# Patient Record
Sex: Female | Born: 1990 | Race: Black or African American | Hispanic: No | Marital: Single | Smoking: Never smoker
Health system: Southern US, Community
[De-identification: ages and names within clinical notes are randomized; demographics above are authoritative.]

## PROBLEM LIST (undated history)

## (undated) ENCOUNTER — Inpatient Hospital Stay (HOSPITAL_COMMUNITY): Payer: Self-pay

## (undated) DIAGNOSIS — N926 Irregular menstruation, unspecified: Secondary | ICD-10-CM

## (undated) DIAGNOSIS — Z8619 Personal history of other infectious and parasitic diseases: Secondary | ICD-10-CM

## (undated) DIAGNOSIS — T7840XA Allergy, unspecified, initial encounter: Secondary | ICD-10-CM

## (undated) DIAGNOSIS — Z202 Contact with and (suspected) exposure to infections with a predominantly sexual mode of transmission: Secondary | ICD-10-CM

## (undated) HISTORY — DX: Allergy, unspecified, initial encounter: T78.40XA

## (undated) HISTORY — DX: Irregular menstruation, unspecified: N92.6

## (undated) HISTORY — DX: Contact with and (suspected) exposure to infections with a predominantly sexual mode of transmission: Z20.2

## (undated) HISTORY — DX: Personal history of other infectious and parasitic diseases: Z86.19

## (undated) HISTORY — PX: NO PAST SURGERIES: SHX2092

---
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
## 2010-03-27 ENCOUNTER — Emergency Department (HOSPITAL_COMMUNITY)
Admission: EM | Admit: 2010-03-27 | Discharge: 2010-03-27 | Disposition: A | Discharge status: Home / Self Care | Payer: BC Managed Care – PPO | Attending: Emergency Medicine | Admitting: Emergency Medicine

## 2010-03-27 ENCOUNTER — Emergency Department (HOSPITAL_COMMUNITY): Payer: BC Managed Care – PPO

## 2010-03-27 DIAGNOSIS — K5900 Constipation, unspecified: Secondary | ICD-10-CM | POA: Insufficient documentation

## 2010-03-27 DIAGNOSIS — J45909 Unspecified asthma, uncomplicated: Secondary | ICD-10-CM | POA: Insufficient documentation

## 2010-03-27 DIAGNOSIS — R109 Unspecified abdominal pain: Secondary | ICD-10-CM | POA: Insufficient documentation

## 2010-03-27 LAB — URINALYSIS, ROUTINE W REFLEX MICROSCOPIC
Hgb urine dipstick: NEGATIVE
Specific Gravity, Urine: 1.029 (ref 1.005–1.030)
Urobilinogen, UA: 1 mg/dL (ref 0.0–1.0)
pH: 6.5 (ref 5.0–8.0)

## 2010-03-27 LAB — URINE MICROSCOPIC-ADD ON

## 2010-05-03 LAB — URINALYSIS, ROUTINE W REFLEX MICROSCOPIC
Bilirubin Urine: NEGATIVE
Glucose, UA: NEGATIVE mg/dL
Glucose, UA: NEGATIVE mg/dL
Hgb urine dipstick: NEGATIVE
Ketones, ur: 15 mg/dL — AB
Ketones, ur: >80 mg/dL — AB
Nitrite: NEGATIVE
Protein, ur: 30 mg/dL — AB
Specific Gravity, Urine: 1.028 (ref 1.005–1.030)
Specific Gravity, Urine: 1.029 (ref 1.005–1.030)
Urobilinogen, UA: 1 mg/dL (ref 0.0–1.0)
pH: 6 (ref 5.0–8.0)

## 2010-05-03 LAB — URINE MICROSCOPIC-ADD ON

## 2010-05-03 LAB — DIFFERENTIAL
Basophils Relative: 1 % (ref 0–1)
Monocytes Absolute: 0.6 10*3/uL (ref 0.1–1.0)
Monocytes Relative: 8 % (ref 3–12)
Neutro Abs: 5.8 10*3/uL (ref 1.7–7.7)

## 2010-05-03 LAB — CBC
Hemoglobin: 13.4 g/dL (ref 12.0–15.0)
MCHC: 34.4 g/dL (ref 30.0–36.0)
MCV: 94.6 fL (ref 78.0–100.0)
RBC: 4.13 MIL/uL (ref 3.87–5.11)

## 2010-05-03 LAB — BASIC METABOLIC PANEL
CO2: 22 mEq/L (ref 19–32)
Chloride: 102 mEq/L (ref 96–112)
GFR calc Af Amer: >60 mL/min (ref >60)
Sodium: 133 mEq/L — ABNORMAL LOW (ref 135–145)

## 2010-09-19 ENCOUNTER — Encounter: Payer: Self-pay | Admitting: Family Medicine

## 2010-09-19 ENCOUNTER — Ambulatory Visit (INDEPENDENT_AMBULATORY_CARE_PROVIDER_SITE_OTHER): Payer: BC Managed Care – PPO | Admitting: Family Medicine

## 2010-09-19 VITALS — BP 94/60 | HR 80 | Ht 64.0 in | Wt 127.0 lb

## 2010-09-19 DIAGNOSIS — L309 Dermatitis, unspecified: Secondary | ICD-10-CM

## 2010-09-19 DIAGNOSIS — Z309 Encounter for contraceptive management, unspecified: Secondary | ICD-10-CM

## 2010-09-19 DIAGNOSIS — Z9109 Other allergy status, other than to drugs and biological substances: Secondary | ICD-10-CM

## 2010-09-19 DIAGNOSIS — J309 Allergic rhinitis, unspecified: Secondary | ICD-10-CM

## 2010-09-19 DIAGNOSIS — L259 Unspecified contact dermatitis, unspecified cause: Secondary | ICD-10-CM

## 2010-09-19 DIAGNOSIS — K5900 Constipation, unspecified: Secondary | ICD-10-CM | POA: Insufficient documentation

## 2010-09-19 DIAGNOSIS — J45909 Unspecified asthma, uncomplicated: Secondary | ICD-10-CM | POA: Insufficient documentation

## 2010-09-19 DIAGNOSIS — M549 Dorsalgia, unspecified: Secondary | ICD-10-CM

## 2010-09-19 MED ORDER — ALBUTEROL SULFATE HFA 108 (90 BASE) MCG/ACT IN AERS: 2.0000 | INHALATION_SPRAY | RESPIRATORY_TRACT | Status: DC | PRN

## 2010-09-19 MED ORDER — EPINEPHRINE 0.3 MG/0.3ML IJ DEVI: 0.3000 mg | Freq: Once | INTRAMUSCULAR | Status: DC

## 2010-09-19 MED ORDER — TRIAMCINOLONE ACETONIDE 0.1 % EX OINT: TOPICAL_OINTMENT | Freq: Two times a day (BID) | CUTANEOUS | Status: DC

## 2010-09-19 NOTE — Patient Instructions (Signed)
Think about the new birth control options, if you are leaning toward Implanon or Norplanon insert then this will have to be done at your OB/GYN office Use the inhaler as needed during the winter and spring months Use the cream for your eczema, try Eucerin brand lotion You can try Miralax- if you have bad constipation- follow instructions on the bottle I will get your PAP Smear records from Dr. Esmond Camper office I recommend that you get a flu shot  For your back- for severe pain use Ibuprofen, heating pad  Next visit in 6 months

## 2010-09-19 NOTE — Assessment & Plan Note (Signed)
Her exam was normal. This may be related to musculoskeletal strain during her job. I advised her to use over-the-counter NSAIDs as well as heating pad. If this persists or gets worse we will obtain X  rays

## 2010-09-19 NOTE — Assessment & Plan Note (Signed)
Advised the use of Benadryl if patient able to swallow. She should keep this with her at all times. I will send an EpiPen. I will also send her to an allergist

## 2010-09-19 NOTE — Assessment & Plan Note (Signed)
Encourage plenty of fiber and water. She is a very picky eater. Also advised MiraLAX if she has not had a bowel movement in many days

## 2010-09-19 NOTE — Assessment & Plan Note (Signed)
The patient has intermittent asthma. I have given her an albuterol inhaler to use as needed. It does not seem like she needs a maintenance inhaler at this time

## 2010-09-19 NOTE — Assessment & Plan Note (Signed)
Triamcinolone cream given. Eucerin for hydration
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
## 2010-09-19 NOTE — Assessment & Plan Note (Signed)
We discussed some options. I gave her a handout. Her other options besides birth control pills will make her cycles even more irregular. She will review this. If she decides to have been implant placed she will need to see her GYN for this. I did advise against an IUD as she has never had a child.

## 2010-11-17 ENCOUNTER — Encounter (HOSPITAL_COMMUNITY): Payer: Self-pay | Admitting: *Deleted

## 2010-11-17 ENCOUNTER — Inpatient Hospital Stay (HOSPITAL_COMMUNITY)
Admission: AD | Admit: 2010-11-17 | Discharge: 2010-11-17 | Disposition: A | Discharge status: Home / Self Care | Payer: BC Managed Care – PPO | Source: Ambulatory Visit | Attending: Obstetrics and Gynecology | Admitting: Obstetrics and Gynecology

## 2010-11-17 ENCOUNTER — Inpatient Hospital Stay (HOSPITAL_COMMUNITY): Payer: BC Managed Care – PPO

## 2010-11-17 DIAGNOSIS — O209 Hemorrhage in early pregnancy, unspecified: Secondary | ICD-10-CM | POA: Insufficient documentation

## 2010-11-17 LAB — URINE MICROSCOPIC-ADD ON

## 2010-11-17 LAB — URINALYSIS, ROUTINE W REFLEX MICROSCOPIC
Bilirubin Urine: NEGATIVE
Glucose, UA: NEGATIVE mg/dL
Ketones, ur: 15 mg/dL — AB
Protein, ur: NEGATIVE mg/dL
pH: 6 (ref 5.0–8.0)

## 2010-11-17 NOTE — Progress Notes (Signed)
Pt started bleeding bright red  one hr ago, now blood is brown/black.  No cramping or abd pain, denies any UTI symptoms.

## 2010-11-17 NOTE — Progress Notes (Signed)
Pt reports having vaginal bleeding like a period thjat started about an hour ago. deneis pain or cramping at this time

## 2010-11-17 NOTE — ED Provider Notes (Signed)
History     Chief Complaint  Patient presents with  . Vaginal Bleeding   HPI G1 8.4 wks pregnancy by dates, awaiting [facility] at [facility]. C/o vaginal bleeding and cramping.  No trauma/ intercourse.   Past Medical History  Diagnosis Date  . Asthma   . Allergy     seasonal, food  . Constipation   . Chlamydia contact, treated     Age 20    No past surgical history on file.  Family History  Problem Relation Age of Onset  . Cancer Maternal Grandmother     breast   History  Substance Use Topics  . Smoking status: Never Smoker   . Smokeless tobacco: Not on file  . Alcohol Use: Yes     socially   Allergies:  Allergies  Allergen Reactions  . Food Anaphylaxis    NUTS  . Shellfish Allergy Anaphylaxis   Prescriptions prior to admission  Medication Sig Dispense Refill  . albuterol (VENTOLIN HFA) 108 (90 BASE) MCG/ACT inhaler Inhale 2 puffs into the lungs every 4 (four) hours as needed for wheezing.  1 Inhaler  3  . prenatal vitamin w/FE, FA (PRENATAL 1 + 1) 27-1 MG TABS Take 1 tablet by mouth daily.        Marland Kitchen desogestrel-ethinyl estradiol (APRI,EMOQUETTE,SOLIA) 0.15-30 MG-MCG tablet Take 1 tablet by mouth daily.        Marland Kitchen EPINEPHrine (EPIPEN) 0.3 mg/0.3 mL DEVI Inject 0.3 mLs (0.3 mg total) into the muscle once.  1 Device  3  . triamcinolone (KENALOG) 0.1 % ointment Apply topically 2 (two) times daily. As needed for eczema  30 g  6    ROS denies dizziness/ SOB/ chest pain.   Physical Exam   Blood pressure 112/62, pulse 89, temperature 98.7 F (37.1 C), temperature source Oral, resp. rate 16, height 5\' 4"  (1.626 m), weight 59.966 kg (132 lb 3.2 oz), last menstrual period 09/18/2010.  Physical Exam A&O x 3, no acute distress. Pleasant HEENT neg, no thyromegaly Lungs CTA bilat CV RRR, S1S2 normal Abdo soft, non tender, non acute Extr no edema/ tenderness Pelvic Cx closed, long, no active bleeding. Sono- live single IUP  MAU Course  Procedures Pelvic sono --> Live  single IUP 8.5/7 wks. Nl ovaries. Blood type --> B(+)   Assessment and Plan  First trimester bleeding.  ABO/Rh, pelvic sono. Office f/up 1-2 wks.   Kirke Breach R 11/17/2010, 5:15 PM

## 2010-11-20 ENCOUNTER — Encounter (HOSPITAL_COMMUNITY): Payer: Self-pay | Admitting: *Deleted

## 2010-11-20 ENCOUNTER — Inpatient Hospital Stay (HOSPITAL_COMMUNITY)
Admission: AD | Admit: 2010-11-20 | Discharge: 2010-11-20 | Disposition: A | Discharge status: Home / Self Care | Payer: BC Managed Care – PPO | Source: Ambulatory Visit | Attending: Obstetrics & Gynecology | Admitting: Obstetrics & Gynecology

## 2010-11-20 DIAGNOSIS — O99891 Other specified diseases and conditions complicating pregnancy: Secondary | ICD-10-CM | POA: Insufficient documentation

## 2010-11-20 DIAGNOSIS — IMO0002 Reserved for concepts with insufficient information to code with codable children: Secondary | ICD-10-CM

## 2010-11-20 DIAGNOSIS — M545 Low back pain: Secondary | ICD-10-CM

## 2010-11-20 DIAGNOSIS — Y92009 Unspecified place in unspecified non-institutional (private) residence as the place of occurrence of the external cause: Secondary | ICD-10-CM | POA: Insufficient documentation
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
## 2010-11-20 NOTE — Progress Notes (Signed)
Dr cousins called back and states that patient have not been seen in their office for this pregnancy. She was told prior to her appointment that her insurance does not have pregnancy coverage. Patient then states that she will apply for medicaid and their office do not accept. Dr cousins states that the patient is faculty practice and should be changed as unassigned.
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
## 2010-11-20 NOTE — ED Provider Notes (Signed)
Attestation of Attending Supervision of Advanced Practitioner: Evaluation and management procedures were performed by the PA/NP/CNM/OB Fellow under my supervision/collaboration. Chart reviewed and agree with management and plan.  Hades Mathew A M.D. 11/20/2010 4:40 AM   

## 2010-11-20 NOTE — Progress Notes (Signed)
WAS IN OFFICE TODAY-  PT HAS BC/BS-  IT DOES NOT  COVER PREG- THEY TOLD HER THEY NEED MONEY  BEFORE SHE GETS SEEN..  HAS BEEN   THERE FOR   GYN- PAP SMEARS.
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
## 2011-01-28 NOTE — L&D Delivery Note (Signed)
Delivery Note Plan for water birth, used tub briefly awaiting epidural, to bed, R side lying, SVD, easy delivery of the head, easy delivery of the shoulders, cord doubly clamped and cut by father of baby, baby on pt abdomin. At 12:38 PM a viable female was delivered via Vaginal, Spontaneous Delivery (Presentation: Left Occiput Anterior).  APGAR: 9, 9; weight .   Placenta status: Intact, Spontaneous.  Cord: 3 vessels. Intact placenta.  Anesthesia: Local  Episiotomy: None Lacerations:  Suture Repair: 3.0 monocryl Est. Blood Loss (mL):   Mom to postpartum.  Baby to rooming in.  Cynthia Mcdowell 06/24/2011, 1:07 PM

## 2011-02-06 LAB — OB RESULTS CONSOLE HIV ANTIBODY (ROUTINE TESTING): HIV: NONREACTIVE

## 2011-02-06 LAB — CBC
HCT: 36 % (ref 36–46)
Hemoglobin: 12.8 g/dL (ref 12.0–16.0)
Platelets: 213 10*3/uL (ref 150–399)

## 2011-02-06 LAB — OB RESULTS CONSOLE ABO/RH: RH Type: POSITIVE

## 2011-02-06 LAB — OB RESULTS CONSOLE GC/CHLAMYDIA
Chlamydia: NEGATIVE
Gonorrhea: NEGATIVE

## 2011-02-06 LAB — OB RESULTS CONSOLE ANTIBODY SCREEN: Antibody Screen: NEGATIVE

## 2011-02-12 ENCOUNTER — Other Ambulatory Visit (HOSPITAL_COMMUNITY): Payer: Self-pay | Admitting: Obstetrics and Gynecology

## 2011-02-12 ENCOUNTER — Other Ambulatory Visit: Payer: Self-pay

## 2011-02-12 DIAGNOSIS — IMO0002 Reserved for concepts with insufficient information to code with codable children: Secondary | ICD-10-CM

## 2011-02-18 ENCOUNTER — Ambulatory Visit (HOSPITAL_COMMUNITY)
Admission: RE | Admit: 2011-02-18 | Discharge: 2011-02-18 | Disposition: A | Discharge status: Home / Self Care | Payer: BC Managed Care – PPO | Source: Ambulatory Visit | Attending: Obstetrics and Gynecology | Admitting: Obstetrics and Gynecology

## 2011-02-18 DIAGNOSIS — Z363 Encounter for antenatal screening for malformations: Secondary | ICD-10-CM | POA: Insufficient documentation

## 2011-02-18 DIAGNOSIS — Z1389 Encounter for screening for other disorder: Secondary | ICD-10-CM | POA: Insufficient documentation

## 2011-02-18 DIAGNOSIS — O358XX0 Maternal care for other (suspected) fetal abnormality and damage, not applicable or unspecified: Secondary | ICD-10-CM | POA: Insufficient documentation

## 2011-02-18 DIAGNOSIS — IMO0002 Reserved for concepts with insufficient information to code with codable children: Secondary | ICD-10-CM

## 2011-03-20 ENCOUNTER — Ambulatory Visit: Payer: BC Managed Care – PPO | Admitting: Family Medicine

## 2011-03-20 ENCOUNTER — Encounter: Payer: Self-pay | Admitting: Family Medicine

## 2011-04-10 ENCOUNTER — Other Ambulatory Visit: Payer: Medicaid Other

## 2011-04-10 ENCOUNTER — Encounter (INDEPENDENT_AMBULATORY_CARE_PROVIDER_SITE_OTHER): Payer: Medicaid Other

## 2011-04-10 DIAGNOSIS — Z331 Pregnant state, incidental: Secondary | ICD-10-CM

## 2011-04-24 ENCOUNTER — Encounter (INDEPENDENT_AMBULATORY_CARE_PROVIDER_SITE_OTHER): Payer: Medicaid Other | Admitting: Registered Nurse

## 2011-04-24 DIAGNOSIS — Z3480 Encounter for supervision of other normal pregnancy, unspecified trimester: Secondary | ICD-10-CM

## 2011-05-08 ENCOUNTER — Encounter: Payer: Self-pay | Admitting: Obstetrics and Gynecology

## 2011-05-08 ENCOUNTER — Ambulatory Visit (INDEPENDENT_AMBULATORY_CARE_PROVIDER_SITE_OTHER): Payer: Medicaid Other | Admitting: Obstetrics and Gynecology

## 2011-05-08 VITALS — BP 90/60 | Wt 152.0 lb

## 2011-05-08 DIAGNOSIS — Z3400 Encounter for supervision of normal first pregnancy, unspecified trimester: Secondary | ICD-10-CM

## 2011-05-08 NOTE — Progress Notes (Signed)
No complaints except feels tired but sleeping ok Pt not drinking enough water recs discussed FKCs discussed GBS/GC/CT at NV

## 2011-05-23 ENCOUNTER — Ambulatory Visit (INDEPENDENT_AMBULATORY_CARE_PROVIDER_SITE_OTHER): Payer: Medicaid Other | Admitting: Obstetrics and Gynecology

## 2011-05-23 ENCOUNTER — Encounter: Payer: Self-pay | Admitting: Obstetrics and Gynecology

## 2011-05-23 VITALS — BP 100/60 | Wt 155.0 lb

## 2011-05-23 DIAGNOSIS — Z331 Pregnant state, incidental: Secondary | ICD-10-CM

## 2011-05-23 NOTE — Progress Notes (Signed)
BStrep, GC, Chl sent. RTO 1 week. AVS

## 2011-05-25 LAB — STREP B DNA PROBE: GBSP: NEGATIVE

## 2011-05-30 ENCOUNTER — Encounter: Payer: Self-pay | Admitting: Obstetrics and Gynecology

## 2011-05-30 ENCOUNTER — Ambulatory Visit (INDEPENDENT_AMBULATORY_CARE_PROVIDER_SITE_OTHER): Payer: Medicaid Other | Admitting: Obstetrics and Gynecology

## 2011-05-30 VITALS — BP 94/58 | Wt 155.0 lb

## 2011-05-30 DIAGNOSIS — Z331 Pregnant state, incidental: Secondary | ICD-10-CM

## 2011-05-30 DIAGNOSIS — J45909 Unspecified asthma, uncomplicated: Secondary | ICD-10-CM

## 2011-05-30 NOTE — Progress Notes (Signed)
Pt. Stated no issues today.pt wants cervix check today.

## 2011-06-03 ENCOUNTER — Encounter: Payer: Self-pay | Admitting: Obstetrics and Gynecology

## 2011-06-03 ENCOUNTER — Ambulatory Visit (INDEPENDENT_AMBULATORY_CARE_PROVIDER_SITE_OTHER): Payer: Medicaid Other | Admitting: Obstetrics and Gynecology

## 2011-06-03 VITALS — BP 98/66 | Wt 155.5 lb

## 2011-06-03 DIAGNOSIS — Z3490 Encounter for supervision of normal pregnancy, unspecified, unspecified trimester: Secondary | ICD-10-CM

## 2011-06-03 NOTE — Patient Instructions (Signed)
Pt need list of [facility] Pediatricians

## 2011-06-03 NOTE — Progress Notes (Signed)
Poor sleepng, hints given

## 2011-06-07 ENCOUNTER — Inpatient Hospital Stay (HOSPITAL_COMMUNITY)
Admission: AD | Admit: 2011-06-07 | Discharge: 2011-06-08 | Disposition: A | Discharge status: Home / Self Care | Payer: Medicaid Other | Source: Ambulatory Visit | Attending: Obstetrics and Gynecology | Admitting: Obstetrics and Gynecology

## 2011-06-07 DIAGNOSIS — O219 Vomiting of pregnancy, unspecified: Secondary | ICD-10-CM

## 2011-06-07 DIAGNOSIS — O212 Late vomiting of pregnancy: Secondary | ICD-10-CM | POA: Insufficient documentation

## 2011-06-07 NOTE — MAU Note (Signed)
Started vomiting about 30 mins ago and have vomited 4 times. No diarrhea

## 2011-06-08 ENCOUNTER — Encounter (HOSPITAL_COMMUNITY): Payer: Self-pay | Admitting: *Deleted

## 2011-06-08 DIAGNOSIS — O218 Other vomiting complicating pregnancy: Secondary | ICD-10-CM

## 2011-06-08 LAB — URINALYSIS, ROUTINE W REFLEX MICROSCOPIC
Glucose, UA: NEGATIVE mg/dL
Ketones, ur: NEGATIVE mg/dL
Nitrite: NEGATIVE
Specific Gravity, Urine: 1.01 (ref 1.005–1.030)
pH: 7 (ref 5.0–8.0)

## 2011-06-08 LAB — URINE MICROSCOPIC-ADD ON

## 2011-06-08 MED ORDER — FAMOTIDINE 20 MG PO TABS
20.0000 mg | ORAL_TABLET | Freq: Once | ORAL | Status: AC
  Administered 2011-06-08: 20 mg via ORAL
  Filled 2011-06-08 (×2): qty 1

## 2011-06-08 MED ORDER — ACETAMINOPHEN 325 MG PO TABS
650.0000 mg | ORAL_TABLET | Freq: Four times a day (QID) | ORAL | Status: DC | PRN
  Administered 2011-06-08: 650 mg via ORAL
  Filled 2011-06-08: qty 2

## 2011-06-08 NOTE — Progress Notes (Signed)
Nona SMith CNM notified of pt's admission and status. Will see pt 

## 2011-06-08 NOTE — OB Triage Provider Note (Signed)
S:  Pt with no further emesis since admission.  Reports her headache has also resolved.  Denies any further      complaints at present.  O:  No change in physical assessment at present. BP 104/66.       FHR baseline 145.  Moderate variability present.  Accels present.  Variable decel x 1 noted.         Uterine irritability now resolved.  UCs every 7-57mins but pt not aware of UCs.  A:  Vomiting in pregnancy  P:  Discharge to home.      F/U at [facility] on Wednesday, 06/11/11 as previously scheduled.      Revd s/s labor and FKC.

## 2011-06-08 NOTE — Progress Notes (Signed)
Elsie Ra CNM in earlier and discussed d/c plan and instructions. Written and verbal d/c instructions given and understanding voiced.

## 2011-06-08 NOTE — MAU Provider Note (Signed)
History     CSN: [id number]  Arrival date and time: 06/07/11 2335   First Provider Initiated Contact with Patient 06/08/11 0027      Chief Complaint  Patient presents with  . Emesis During Pregnancy   HPI Pt is a G1 P0 who presents unannounced to MAU at 37w 5d gestation with complaints of 4 episodes of emesis in approximately which began suddenly this evening approximately 1-2 hrs prior to presentation.  She states she was concerned due to the fact she has not experienced any emesis during this pregnancy until this evening.  She denies any current nausea and states her last episode of emesis was approximately 1 hr ago.  She denies any continued abdominal pain.  She denies fever, diarrhea, constipation, or heartburn.  She denies any UCs, ROM or bldg and states her fetus has been moving normally.  She denies any known exposure to ill persons with similar symptoms.  She states her last meal was earlier this evening and consisted of "ribs and creamed corn".  She also report eating 2 cheeseburgers and cereal today.  She denies any other pregnancy complications.  Her past medical hx is significant for asthma and states she began having some wheezing after vomiting which she treated with albuterol inhaler but currently denies any SOB.  She also reports mild headache since vomiting.  She denies vision chgs, RUQ pain.    OB History    Grav Para Term Preterm Abortions TAB SAB Ect Mult Living   1               Past Medical History  Diagnosis Date  . Allergy     seasonal, food  . Constipation 08/2008  . Chlamydia contact, treated     Age 64   . H/O candidiasis   . H/O varicella   . Asthma     Past Surgical History  Procedure Date  . No past surgeries     Family History  Problem Relation Age of Onset  . Cancer Maternal Grandmother     breast  . Hypertension Mother   . Heart disease Maternal Uncle   . Anesthesia problems Neg Hx   . Hypotension Neg Hx   . Malignant hyperthermia  Neg Hx   . Pseudochol deficiency Neg Hx     History  Substance Use Topics  . Smoking status: Never Smoker   . Smokeless tobacco: Never Used  . Alcohol Use: No    Allergies:  Allergies  Allergen Reactions  . 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 Shellfish Allergy Anaphylaxis    Prescriptions prior to admission  Medication Sig Dispense Refill  . ALBUTEROL IN Inhale 2 puffs into the lungs every 4 (four) hours as needed.      Marland Kitchen EPINEPHrine (EPIPEN) 0.3 mg/0.3 mL DEVI Inject 0.3 mLs (0.3 mg total) into the muscle once.  1 Device  3  . prenatal vitamin w/FE, FA (PRENATAL 1 + 1) 27-1 MG TABS Take 1 tablet by mouth daily.          Review of Systems  Constitutional: Negative.   HENT: Negative.   Eyes: Negative.   Respiratory: Negative.   Cardiovascular: Negative.   Gastrointestinal: Negative.   Genitourinary: Negative.   Musculoskeletal: Negative.   Skin: Negative.   Neurological: Negative.   Endo/Heme/Allergies: Negative.   Psychiatric/Behavioral: Negative.    Physical Exam   Blood pressure 112/67, pulse 115, temperature 97.3 F (36.3 C), temperature source  Oral, resp. rate 16, height 5\' 3"  (1.6 m), weight 71.215 kg (157 lb), last menstrual period 09/18/2010.  Physical Exam  Constitutional: She is oriented to person, place, and time. She appears well-developed and well-nourished.  HENT:  Head: Normocephalic and atraumatic.  Right Ear: External ear normal.  Left Ear: External ear normal.  Nose: Nose normal.  Eyes: Conjunctivae are normal. Pupils are equal, round, and reactive to light.  Neck: Normal range of motion. Neck supple.  Cardiovascular: Normal rate, regular rhythm, normal heart sounds and intact distal pulses.   Respiratory: Effort normal and breath sounds normal.  GI: Soft. Bowel sounds are normal.       Gravid.  FH 37cm.    Genitourinary: Vagina normal and uterus normal.       Ut non-tender.  Musculoskeletal: Normal range  of motion.  Neurological: She is alert and oriented to person, place, and time. She has normal reflexes.  Skin: Skin is warm and dry.  Psychiatric: She has a normal mood and affect. Her behavior is normal.   SVE: Dilation: 3cm Effacement %ile: 70% Station: -2 Presentation: Vtx Posterior/soft  FHR 145 with moderate variability present.  Accels present.  No decels present. Uterine irritability noted with UC's every 2-29mins noted, mild to palpation and pt not aware of UCs.    MAU Course  Procedures Results for orders placed during the hospital encounter of 06/07/11 (from the past 24 hour(s))  URINALYSIS, ROUTINE W REFLEX MICROSCOPIC     Status: Abnormal   Collection Time   06/07/11 11:45 PM      Component Value Range   Color, Urine YELLOW  YELLOW    APPearance CLEAR  CLEAR    Specific Gravity, Urine 1.010  1.005 - 1.030    pH 7.0  5.0 - 8.0    Glucose, UA NEGATIVE  NEGATIVE (mg/dL)   Hgb urine dipstick NEGATIVE  NEGATIVE    Bilirubin Urine NEGATIVE  NEGATIVE    Ketones, ur NEGATIVE  NEGATIVE (mg/dL)   Protein, ur NEGATIVE  NEGATIVE (mg/dL)   Urobilinogen, UA 1.0  0.0 - 1.0 (mg/dL)   Nitrite NEGATIVE  NEGATIVE    Leukocytes, UA TRACE (*) NEGATIVE   URINE MICROSCOPIC-ADD ON     Status: Abnormal   Collection Time   06/07/11 11:45 PM      Component Value Range   Squamous Epithelial / LPF FEW (*) RARE    WBC, UA 3-6  <3 (WBC/hpf)   Bacteria, UA RARE  RARE    Urine-Other MUCOUS PRESENT     Assessment and Plan  IUP at 37w 5d Nausea/vomiting  Rx Pepcid and Tylenol. PO hydration Observe for further emesis.  Joshua Soulier O. 06/08/2011, 12:38 AM

## 2011-06-11 ENCOUNTER — Encounter: Payer: Self-pay | Admitting: Obstetrics and Gynecology

## 2011-06-11 ENCOUNTER — Ambulatory Visit (INDEPENDENT_AMBULATORY_CARE_PROVIDER_SITE_OTHER): Payer: Medicaid Other | Admitting: Obstetrics and Gynecology

## 2011-06-11 VITALS — BP 94/50 | Wt 155.0 lb

## 2011-06-11 DIAGNOSIS — Z331 Pregnant state, incidental: Secondary | ICD-10-CM

## 2011-06-11 NOTE — Progress Notes (Signed)
Pt went to [facility] on Sunday May 12 for vomiting was told she had dilated to 3.5 cm and she was discharged. JM

## 2011-06-11 NOTE — Progress Notes (Signed)
Better. Irregular contractions. No change in D/C. No LOF no bleeding

## 2011-06-18 ENCOUNTER — Encounter: Payer: Self-pay | Admitting: Obstetrics and Gynecology

## 2011-06-18 ENCOUNTER — Ambulatory Visit (INDEPENDENT_AMBULATORY_CARE_PROVIDER_SITE_OTHER): Payer: Medicaid Other | Admitting: Obstetrics and Gynecology

## 2011-06-18 VITALS — BP 90/64 | Wt 159.0 lb

## 2011-06-18 DIAGNOSIS — Z331 Pregnant state, incidental: Secondary | ICD-10-CM
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
## 2011-06-18 NOTE — Progress Notes (Signed)
Pt wants membranes swept today  C/o mucous plug gradually expelling

## 2011-06-24 ENCOUNTER — Encounter (HOSPITAL_COMMUNITY): Payer: Self-pay | Admitting: *Deleted

## 2011-06-24 ENCOUNTER — Inpatient Hospital Stay (HOSPITAL_COMMUNITY)
Admission: AD | Admit: 2011-06-24 | Discharge: 2011-06-26 | DRG: 775 | Disposition: A | Discharge status: Home / Self Care | Payer: Medicaid Other | Source: Ambulatory Visit | Attending: Obstetrics and Gynecology | Admitting: Obstetrics and Gynecology

## 2011-06-24 ENCOUNTER — Encounter (HOSPITAL_COMMUNITY): Payer: Self-pay | Admitting: Anesthesiology

## 2011-06-24 ENCOUNTER — Inpatient Hospital Stay (HOSPITAL_COMMUNITY)
Admission: AD | Admit: 2011-06-24 | Discharge: 2011-06-24 | Disposition: A | Discharge status: Home / Self Care | Payer: Medicaid Other | Source: Ambulatory Visit | Attending: Obstetrics and Gynecology | Admitting: Obstetrics and Gynecology

## 2011-06-24 DIAGNOSIS — O479 False labor, unspecified: Secondary | ICD-10-CM

## 2011-06-24 DIAGNOSIS — IMO0001 Reserved for inherently not codable concepts without codable children: Secondary | ICD-10-CM

## 2011-06-24 DIAGNOSIS — O471 False labor at or after 37 completed weeks of gestation: Secondary | ICD-10-CM

## 2011-06-24 LAB — CBC
HCT: 39.5 % (ref 36.0–46.0)
Hemoglobin: 13.9 g/dL (ref 12.0–15.0)
MCHC: 35.2 g/dL (ref 30.0–36.0)
RBC: 4.35 MIL/uL (ref 3.87–5.11)

## 2011-06-24 MED ORDER — OXYTOCIN BOLUS FROM INFUSION
500.0000 mL | Freq: Once | INTRAVENOUS | Status: AC
  Administered 2011-06-24: 500 mL via INTRAVENOUS
  Filled 2011-06-24: qty 1000
  Filled 2011-06-24: qty 500

## 2011-06-24 MED ORDER — IBUPROFEN 600 MG PO TABS: 600.0000 mg | ORAL_TABLET | Freq: Four times a day (QID) | ORAL | Status: DC | PRN

## 2011-06-24 MED ORDER — ALBUTEROL SULFATE HFA 108 (90 BASE) MCG/ACT IN AERS
2.0000 | INHALATION_SPRAY | RESPIRATORY_TRACT | Status: DC | PRN
  Administered 2011-06-24: 2 via RESPIRATORY_TRACT
  Filled 2011-06-24: qty 6.7

## 2011-06-24 MED ORDER — OXYCODONE-ACETAMINOPHEN 5-325 MG PO TABS: 1.0000 | ORAL_TABLET | ORAL | Status: DC | PRN

## 2011-06-24 MED ORDER — WITCH HAZEL-GLYCERIN EX PADS: 1.0000 "application " | MEDICATED_PAD | CUTANEOUS | Status: DC | PRN

## 2011-06-24 MED ORDER — PHENYLEPHRINE 40 MCG/ML (10ML) SYRINGE FOR IV PUSH (FOR BLOOD PRESSURE SUPPORT): 80.0000 ug | PREFILLED_SYRINGE | INTRAVENOUS | Status: DC | PRN

## 2011-06-24 MED ORDER — LANOLIN HYDROUS EX OINT: TOPICAL_OINTMENT | CUTANEOUS | Status: DC | PRN

## 2011-06-24 MED ORDER — LACTATED RINGERS IV SOLN
INTRAVENOUS | Status: DC
  Administered 2011-06-24: 12:00:00 via INTRAVENOUS

## 2011-06-24 MED ORDER — FENTANYL 2.5 MCG/ML BUPIVACAINE 1/10 % EPIDURAL INFUSION (WH - ANES)
14.0000 mL/h | INTRAMUSCULAR | Status: DC
  Filled 2011-06-24: qty 60

## 2011-06-24 MED ORDER — DIPHENHYDRAMINE HCL 50 MG/ML IJ SOLN: 12.5000 mg | INTRAMUSCULAR | Status: DC | PRN

## 2011-06-24 MED ORDER — ONDANSETRON HCL 4 MG/2ML IJ SOLN: 4.0000 mg | INTRAMUSCULAR | Status: DC | PRN

## 2011-06-24 MED ORDER — ZOLPIDEM TARTRATE 5 MG PO TABS: 5.0000 mg | ORAL_TABLET | Freq: Every evening | ORAL | Status: DC | PRN

## 2011-06-24 MED ORDER — DIBUCAINE 1 % RE OINT: 1.0000 "application " | TOPICAL_OINTMENT | RECTAL | Status: DC | PRN

## 2011-06-24 MED ORDER — LIDOCAINE HCL (PF) 1 % IJ SOLN
30.0000 mL | INTRAMUSCULAR | Status: DC | PRN
  Administered 2011-06-24: 30 mL via SUBCUTANEOUS
  Filled 2011-06-24: qty 30

## 2011-06-24 MED ORDER — PRENATAL MULTIVITAMIN CH
1.0000 | ORAL_TABLET | Freq: Every day | ORAL | Status: DC
  Administered 2011-06-24 – 2011-06-26 (×3): 1 via ORAL
  Filled 2011-06-24 (×3): qty 1

## 2011-06-24 MED ORDER — EPHEDRINE 5 MG/ML INJ: 10.0000 mg | INTRAVENOUS | Status: DC | PRN

## 2011-06-24 MED ORDER — ONDANSETRON HCL 4 MG/2ML IJ SOLN: 4.0000 mg | Freq: Four times a day (QID) | INTRAMUSCULAR | Status: DC | PRN

## 2011-06-24 MED ORDER — SIMETHICONE 80 MG PO CHEW: 80.0000 mg | CHEWABLE_TABLET | ORAL | Status: DC | PRN

## 2011-06-24 MED ORDER — PHENYLEPHRINE 40 MCG/ML (10ML) SYRINGE FOR IV PUSH (FOR BLOOD PRESSURE SUPPORT)
80.0000 ug | PREFILLED_SYRINGE | INTRAVENOUS | Status: DC | PRN
  Filled 2011-06-24: qty 5

## 2011-06-24 MED ORDER — OXYTOCIN 20 UNITS IN LACTATED RINGERS INFUSION - SIMPLE: 125.0000 mL/h | Freq: Once | INTRAVENOUS | Status: DC

## 2011-06-24 MED ORDER — EPHEDRINE 5 MG/ML INJ
10.0000 mg | INTRAVENOUS | Status: DC | PRN
  Filled 2011-06-24: qty 4

## 2011-06-24 MED ORDER — LACTATED RINGERS IV SOLN
500.0000 mL | Freq: Once | INTRAVENOUS | Status: AC
  Administered 2011-06-24: 500 mL via INTRAVENOUS

## 2011-06-24 MED ORDER — FLEET ENEMA 7-19 GM/118ML RE ENEM: 1.0000 | ENEMA | RECTAL | Status: DC | PRN

## 2011-06-24 MED ORDER — ACETAMINOPHEN 325 MG PO TABS: 650.0000 mg | ORAL_TABLET | ORAL | Status: DC | PRN

## 2011-06-24 MED ORDER — TETANUS-DIPHTH-ACELL PERTUSSIS 5-2.5-18.5 LF-MCG/0.5 IM SUSP
0.5000 mL | Freq: Once | INTRAMUSCULAR | Status: AC
  Administered 2011-06-25: 0.5 mL via INTRAMUSCULAR
  Filled 2011-06-24: qty 0.5

## 2011-06-24 MED ORDER — DIPHENHYDRAMINE HCL 25 MG PO CAPS: 25.0000 mg | ORAL_CAPSULE | Freq: Four times a day (QID) | ORAL | Status: DC | PRN

## 2011-06-24 MED ORDER — SENNOSIDES-DOCUSATE SODIUM 8.6-50 MG PO TABS
2.0000 | ORAL_TABLET | Freq: Every day | ORAL | Status: DC
  Administered 2011-06-24 – 2011-06-25 (×2): 2 via ORAL

## 2011-06-24 MED ORDER — CITRIC ACID-SODIUM CITRATE 334-500 MG/5ML PO SOLN: 30.0000 mL | ORAL | Status: DC | PRN

## 2011-06-24 MED ORDER — ALBUTEROL SULFATE HFA 108 (90 BASE) MCG/ACT IN AERS: 1.0000 | INHALATION_SPRAY | RESPIRATORY_TRACT | Status: DC | PRN

## 2011-06-24 MED ORDER — ONDANSETRON HCL 4 MG PO TABS: 4.0000 mg | ORAL_TABLET | ORAL | Status: DC | PRN

## 2011-06-24 MED ORDER — BENZOCAINE-MENTHOL 20-0.5 % EX AERO: 1.0000 "application " | INHALATION_SPRAY | CUTANEOUS | Status: DC | PRN

## 2011-06-24 MED ORDER — IBUPROFEN 600 MG PO TABS
600.0000 mg | ORAL_TABLET | Freq: Four times a day (QID) | ORAL | Status: DC
  Administered 2011-06-24 – 2011-06-26 (×7): 600 mg via ORAL
  Filled 2011-06-24 (×8): qty 1

## 2011-06-24 MED ORDER — BUTORPHANOL TARTRATE 2 MG/ML IJ SOLN: 2.0000 mg | INTRAMUSCULAR | Status: DC | PRN

## 2011-06-24 MED ORDER — LACTATED RINGERS IV SOLN: 500.0000 mL | INTRAVENOUS | Status: DC | PRN

## 2011-06-24 NOTE — MAU Note (Signed)
Pt returned from walking at this time. efm and toco replaced.  

## 2011-06-24 NOTE — MAU Note (Signed)
Conni Elliot, CNM at bedside.  VE done.

## 2011-06-24 NOTE — Progress Notes (Signed)
Attempting to trace FHR through UCs, holding cardio at times.

## 2011-06-24 NOTE — MAU Note (Signed)
Pt says she started contracting an hour ago and they were 4 min apart.  No leaking or bleeding.

## 2011-06-24 NOTE — Progress Notes (Signed)
FHR extremely difficult to trace; pt uncontrollable, screaming with contractions, and flailing all over bed. Attempting to hold cardio through contractions when pt will allow.

## 2011-06-24 NOTE — MAU Provider Note (Signed)
History   CSN: [id number]  Arrival date and time: 06/24/11 0152   None     Chief Complaint  Patient presents with  . Labor Eval   HPI Pt presents unannounced with c/o contractions which began approximately 20hrs ago but have increased in frequency and intensity over the past hour and have been occurring approximately every 5 mins over the past hour.  Denies ROM or bldg.  Active fetus.   OB History    Grav Para Term Preterm Abortions TAB SAB Ect Mult Living   1               Past Medical History  Diagnosis Date  . Allergy     seasonal, food  . Constipation 08/2008  . Chlamydia contact, treated     Age 21   . H/O candidiasis   . H/O varicella   . Asthma     Past Surgical History  Procedure Date  . No past surgeries     Family History  Problem Relation Age of Onset  . Cancer Maternal Grandmother     breast  . Hypertension Mother   . Heart disease Maternal Uncle   . Anesthesia problems Neg Hx   . Hypotension Neg Hx   . Malignant hyperthermia Neg Hx   . Pseudochol deficiency Neg Hx     History  Substance Use Topics  . Smoking status: Never Smoker   . Smokeless tobacco: Never Used  . Alcohol Use: No    Allergies:  Allergies  Allergen Reactions  . 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 Shellfish Allergy Anaphylaxis    Prescriptions prior to admission  Medication Sig Dispense Refill  . ALBUTEROL IN Inhale 2 puffs into the lungs every 4 (four) hours as needed. As needed for asthma      . EPINEPHrine (EPIPEN) 0.3 mg/0.3 mL DEVI Inject 0.3 mLs (0.3 mg total) into the muscle once.  1 Device  3  . Prenatal Vit-Fe Fumarate-FA (PRENATAL MULTIVITAMIN) TABS Take 1 tablet by mouth daily.        Review of Systems  Constitutional: Negative.   HENT: Negative.   Eyes: Negative.   Respiratory: Negative.   Cardiovascular: Negative.   Gastrointestinal: Negative.   Genitourinary: Negative.   Musculoskeletal: Negative.   Skin:  Negative.   Neurological: Negative.   Endo/Heme/Allergies: Negative.   Psychiatric/Behavioral: Negative.    Physical Exam   Blood pressure 109/74, pulse 92, temperature 98.3 F (36.8 C), temperature source Oral, resp. rate 20, height 5\' 3"  (1.6 m), weight 73.029 kg (161 lb), last menstrual period 09/18/2010, SpO2 100.00%.  Physical Exam  Constitutional: She is oriented to person, place, and time. She appears well-developed and well-nourished.  HENT:  Head: Normocephalic and atraumatic.  Right Ear: External ear normal.  Left Ear: External ear normal.  Nose: Nose normal.  Eyes: Conjunctivae are normal. Pupils are equal, round, and reactive to light.  Neck: Normal range of motion. Neck supple.  Cardiovascular: Normal rate, regular rhythm and intact distal pulses.   Respiratory: Effort normal and breath sounds normal.  GI: Soft. Bowel sounds are normal.  Genitourinary: Vagina normal and uterus normal.       Gravid, non-tender.  Ext genitalia WNL.  BUS neg.  Musculoskeletal: Normal range of motion.  Neurological: She is alert and oriented to person, place, and time.  Skin: Skin is warm and dry.  Psychiatric: She has a normal mood and affect. Her behavior is normal.  FHR baseline 135 bpm.  Moderate variability present.  Accels present.  FHR Cat 1 UCs every 7-17mins and mild to palpation  SVE Dilation: 3-4cm Effacement: 70% Station: -1  Vtx  MAU Course  Procedures Ambulatiom  Assessment and Plan  IUP at 40w 0d Contractions  Pt to ambulate x 1 hr then reassess SVE.    Sachi Boulay O. 06/24/2011, 2:31 AM   Addendum: 06/24/11, 4:20 AM S:  Pt walked for approximately 1hr. Reports UCs did not change in frequency or intensity with walking.  She ranks pain as 5/10 which is not as much as yesterday.    O:  FHR baseline 135 bpm with mod variability present and accels present.         UCs every 4-63mins and mild to palpation.  Pt talking with friends/family member in the room  A:   IUP at 40w 0d      False labor.   P:  F/U as sched at [facility] on Wednesday 29, 2013       Reviewed signs/symptoms of labor.       Revd FKC.       Pt declines Ambien and Percocet for therapeutic rest at present.

## 2011-06-24 NOTE — MAU Note (Signed)
Contractions q 4 minutes. 

## 2011-06-24 NOTE — H&P (Signed)
Cynthia Mcdowell is a 21 y.o. female presenting for c/o of uc, some water at 0900, no vag bleeding, 4 cm last night on exam, backache with uc, plans water birth agrees to labs and saline lock. G1 40 week IUP, 8 5/7 week [place] agrees with LMP EDC. Problem list: Asthma Late to prenatal care. Meds:  PNV  Ventolin inhaler prn History OB History    Grav Para Term Preterm Abortions TAB SAB Ect Mult Living   1              Past Medical History  Diagnosis Date  . Allergy     seasonal, food  . Constipation 08/2008  . Chlamydia contact, treated     Age 27   . H/O candidiasis   . H/O varicella   . Asthma    Past Surgical History  Procedure Date  . No past surgeries    Family History: family history includes Cancer in her maternal grandmother; Heart disease in her maternal uncle; and Hypertension in her mother.  There is no history of Anesthesia problems, and Hypotension, and Malignant hyperthermia, and Pseudochol deficiency, . Social History:  reports that she has never smoked. She has never used smokeless tobacco. She reports that she does not drink alcohol or use illicit drugs.  ROS  Dilation: 6 Effacement (%): 100 Station: -1 (bulging bag) Exam by:: Maryln Manuel, CNM Blood pressure 103/66, pulse 95, temperature 96.8 F (36 C), temperature source Oral, resp. rate 18, height 5\' 3"  (1.6 m), weight 161 lb (73.029 kg), last menstrual period 09/18/2010. Now on exam for c/o of pressure vag 7 100 0 VTX Intact Exam Physical Exam tense and deep breathing with uc, HEENT WNL,  lungs clear bilaterally, AP RRR, abd soft nt,no masses, not tympanic bowel sounds active, abdomen nontender, Normal hair distrubition mons pubis,  EGBUS WNL,  sterile speculum exam bulging bag waters neg fern vagina pink, moist normal rugae,  no edema to lower extremities Fhts LTV min to mod variable decels with uc Prenatal labs: ABO, Rh: B/Positive/-- (01/10 0000) Antibody: Negative (01/10 0000) Rubella: Immune  (01/10 0000) RPR: Nonreactive (03/18 0000)  HBsAg: Negative (01/10 0000)  HIV: Non-reactive (01/10 0000)  GBS: NEGATIVE (04/26 1459)  Gc/chl neg/neg Assessment/Plan: 40 week IUP Active labor  Routine admission, collaboration with Dr. Normand Sloop.  Cynthia Mcdowell 06/24/2011, 11:34 AM

## 2011-06-24 NOTE — Progress Notes (Signed)
CNM notified of pt returning from walking.  Will be down to check pt.

## 2011-06-24 NOTE — MAU Note (Signed)
Pt here for labor eval.  Denies any bleeding, reports small amount of lof.

## 2011-06-24 NOTE — Progress Notes (Signed)
EBL 200 ml at delivery. Darvin Dials, CNM 

## 2011-06-24 NOTE — Anesthesia Preprocedure Evaluation (Addendum)
Anesthesia Evaluation  Patient identified by MRN, date of birth, ID band Patient awake    Reviewed: Allergy & Precautions, H&P , Patient's Chart, lab work & pertinent test results  Airway Mallampati: II TM Distance: >3 FB Neck ROM: Full    Dental No notable dental hx. (+) Teeth Intact   Pulmonary asthma ,  breath sounds clear to auscultation  Pulmonary exam normal       Cardiovascular negative cardio ROS  Rhythm:Regular Rate:Normal     Neuro/Psych negative neurological ROS  negative psych ROS   GI/Hepatic negative GI ROS, Neg liver ROS,   Endo/Other  negative endocrine ROS  Renal/GU negative Renal ROS  negative genitourinary   Musculoskeletal negative musculoskeletal ROS (+)   Abdominal Normal abdominal exam  (+)   Peds  Hematology negative hematology ROS (+)   Anesthesia Other Findings   Reproductive/Obstetrics (+) Pregnancy                           Anesthesia Physical Anesthesia Plan  ASA: II  Anesthesia Plan: Epidural   Post-op Pain Management:    Induction: Intravenous  Airway Management Planned:   Additional Equipment:   Intra-op Plan:   Post-operative Plan:   Informed Consent: I have reviewed the patients History and Physical, chart, labs and discussed the procedure including the risks, benefits and alternatives for the proposed anesthesia with the patient or authorized representative who has indicated his/her understanding and acceptance.     Plan Discussed with: Anesthesiologist and Surgeon  Anesthesia Plan Comments: (Epidural never placed as patient was fully dilated after completion of PreOp evaluation.)       Anesthesia Quick Evaluation

## 2011-06-25 ENCOUNTER — Encounter: Payer: Medicaid Other | Admitting: Obstetrics and Gynecology

## 2011-06-25 LAB — CBC
MCHC: 34.5 g/dL (ref 30.0–36.0)
Platelets: 179 10*3/uL (ref 150–400)
RDW: 13.1 % (ref 11.5–15.5)
WBC: 9 10*3/uL (ref 4.0–10.5)

## 2011-06-25 LAB — RPR: RPR Ser Ql: NONREACTIVE

## 2011-06-25 NOTE — Progress Notes (Signed)
S: comfortable, little bleeding, has not slept I just keep looking at her, breast     Feeding well O VSS     abd soft, nt, ff      sm  flowperineum clean intact     -Homans sign bilaterally,          Edema Results for orders placed during the hospital encounter of 06/24/11 (from the past 48 hour(s))  CBC     Status: Normal   Collection Time   06/24/11 11:50 AM      Component Value Range Comment   WBC 6.8  4.0 - 10.5 (K/uL)    RBC 4.35  3.87 - 5.11 (MIL/uL)    Hemoglobin 13.9  12.0 - 15.0 (g/dL)    HCT 16.1  09.6 - 04.5 (%)    MCV 90.8  78.0 - 100.0 (fL)    MCH 32.0  26.0 - 34.0 (pg)    MCHC 35.2  30.0 - 36.0 (g/dL)    RDW 40.9  81.1 - 91.4 (%)    Platelets 187  150 - 400 (K/uL)   RPR     Status: Normal   Collection Time   06/24/11 11:50 AM      Component Value Range Comment   RPR NON REACTIVE  NON REACTIVE    CBC     Status: Abnormal   Collection Time   06/25/11  5:50 AM      Component Value Range Comment   WBC 9.0  4.0 - 10.5 (K/uL)    RBC 3.60 (*) 3.87 - 5.11 (MIL/uL)    Hemoglobin 11.2 (*) 12.0 - 15.0 (g/dL)    HCT 78.2 (*) 95.6 - 46.0 (%)    MCV 90.3  78.0 - 100.0 (fL)    MCH 31.1  26.0 - 34.0 (pg)    MCHC 34.5  30.0 - 36.0 (g/dL)    RDW 21.3  08.6 - 57.8 (%)    Platelets 179  150 - 400 (K/uL)    A normal involution     Lactating     PP day 1 P continue care Lavera Guise, CNM

## 2011-06-25 NOTE — Progress Notes (Signed)
UR chart review completed.  

## 2011-06-26 MED ORDER — IBUPROFEN 600 MG PO TABS: 600.0000 mg | ORAL_TABLET | Freq: Four times a day (QID) | ORAL | Status: AC

## 2011-06-26 MED ORDER — MEDROXYPROGESTERONE ACETATE 150 MG/ML IM SUSP
150.0000 mg | Freq: Once | INTRAMUSCULAR | Status: AC
  Administered 2011-06-26: 150 mg via INTRAMUSCULAR
  Filled 2011-06-26: qty 1

## 2011-06-26 NOTE — Discharge Summary (Signed)
Obstetric Discharge Summary Reason for Admission: onset of labor and rupture of membranes Prenatal Procedures: ultrasound Intrapartum Procedures: spontaneous vaginal delivery Postpartum Procedures: none Complications-Operative and Postpartum: none Hemoglobin  Date Value Range Status  06/25/2011 11.2* 12.0-15.0 (g/dL) Final     DELTA CHECK NOTED     REPEATED TO VERIFY     HCT  Date Value Range Status  06/25/2011 32.5* 36.0-46.0 (%) Final   Hospital Course: Admitted 06/24/11.  Negative GBS. Utilized birth tub for some component of labor, then delivered in the bed.  Delivery was performed by Lavera Guise, CNM, without complication. Patient and baby tolerated the procedure without difficulty, with  no laceration noted. Infant to FTN. Mother and infant then had an uncomplicated postpartum course, with breast feeding going well. Mom's physical exam was WNL, and she was discharged home in stable condition. Contraception plan was Depo Provera, with the first dose given just before discharge.  She received adequate benefit from po pain medications and was using Motrin only, with relief.     Physical Exam:  General: alert Lochia: appropriate Uterine Fundus: firm Incision: Intact DVT Evaluation: No evidence of DVT seen on physical exam. Negative Homan's sign.  Discharge Diagnoses: Term Pregnancy-delivered  Discharge Information: Date: 06/26/2011 Activity: Per [facility] handout Diet: routine Medications: Ibuprofen Condition: stable Instructions: refer to practice specific booklet Discharge to: home Contraception:  Depo Provera at d/c Follow-up Information    Follow up with [facility] in 6 weeks. (Call to make postpartum appointment)          Newborn Data: Live born female  Birth Weight: 7 lb 1.2 oz (3209 g) APGAR: 9, 9  Home with mother.  Nigel Bridgeman 06/26/2011, 9:41 AM

## 2011-06-26 NOTE — Discharge Instructions (Signed)

## 2011-08-05 ENCOUNTER — Ambulatory Visit (INDEPENDENT_AMBULATORY_CARE_PROVIDER_SITE_OTHER): Payer: BC Managed Care – PPO

## 2011-08-05 VITALS — BP 104/78 | Resp 14 | Wt 140.0 lb

## 2011-08-05 DIAGNOSIS — A499 Bacterial infection, unspecified: Secondary | ICD-10-CM

## 2011-08-05 DIAGNOSIS — Z8619 Personal history of other infectious and parasitic diseases: Secondary | ICD-10-CM

## 2011-08-05 DIAGNOSIS — B9689 Other specified bacterial agents as the cause of diseases classified elsewhere: Secondary | ICD-10-CM

## 2011-08-05 DIAGNOSIS — N926 Irregular menstruation, unspecified: Secondary | ICD-10-CM

## 2011-08-05 DIAGNOSIS — N760 Acute vaginitis: Secondary | ICD-10-CM

## 2011-08-05 LAB — POCT WET PREP (WET MOUNT)
WBC, Wet Prep HPF POC: NEGATIVE
pH: 5

## 2011-08-05 MED ORDER — METRONIDAZOLE 0.75 % VA GEL: 1.0000 | Freq: Every day | VAGINAL | Status: AC

## 2011-08-05 MED ORDER — FLORAJEN3 PO CAPS: 1.0000 | ORAL_CAPSULE | Freq: Every day | ORAL | Status: DC

## 2011-08-05 NOTE — Progress Notes (Signed)
Date of delivery: 06/24/11 Female Name: [facility] Vaginal delivery:yes Cesarean section:no Tubal ligation:no GDM:no Breast Feeding:yes Bottle Feeding:yes Post-Partum Blues:no Abnormal pap:no Normal GU function: yes Normal GI function:yes Returning to work:yes End of the month per pt  EPDS: 4

## 2011-08-10 DIAGNOSIS — Z8619 Personal history of other infectious and parasitic diseases: Secondary | ICD-10-CM | POA: Insufficient documentation

## 2011-08-10 DIAGNOSIS — N926 Irregular menstruation, unspecified: Secondary | ICD-10-CM

## 2011-08-10 HISTORY — DX: Irregular menstruation, unspecified: N92.6

## 2011-08-10 NOTE — Progress Notes (Signed)
..   Subjective:     Cynthia Mcdowell is a 21 y.o. female who presents for a postpartum visit.  Infant at appt w/ pt.   She is 6 weeks postpartum following a spontaneous vaginal delivery.  I have fully reviewed the prenatal and intrapartum course.  The delivery was at 40 gestational weeks.  Outcome: spontaneous vaginal delivery.  Anesthesia: epidural. Postpartum course has been uncomplicated.  Baby's course has been uncomplicated.  Baby is feeding by breast.  Bleeding thin lochia.  Bowel function is normal.  Bladder function is normal.  Patient is not sexually active.  Contraception method is Depo-Provera injections.  Postpartum depression screening: negative.  The following portions of the patient's history were reviewed and updated as appropriate: allergies, current medications, past family history, past medical history, past social history, past surgical history and problem list.  Review of Systems Pertinent items are noted in HPI.   Objective:    BP 104/78  Resp 14  Wt 140 lb (63.504 kg)  Breastfeeding? Yes  General:  alert, cooperative and no distress           Abdomen: 1FB diastasis   Vulva:  normal  Vagina: small amt of d/c in vault w/ odor Kegel: 4/5  Cervix:  closed; no CMT  Corpus: normal involution  Adnexa:  normal adnexa  Rectal Exam: Not performed.        Assessment:    6 postpartum exam.  Pap smear not done at today's visit.  BV Lactating Stable asthma Plan:    1. Contraception: Depo-Provera injections and considering LTM (i.e. Nexplanon or Mirena) 2. Rec'd daily kegels, vitamin, sunscreen; healthy lifestyle and safe sex. 3. Follow up at the end of Aug for next Depo shot, or as needed.  Pt to schedule appt if desires to switch contraception 4.  Rx'd Metrogel & Probiotic    Lavone Barrientes H 08/10/2011 9:05 PM

## 2011-10-09 ENCOUNTER — Ambulatory Visit (INDEPENDENT_AMBULATORY_CARE_PROVIDER_SITE_OTHER): Payer: BC Managed Care – PPO | Admitting: Obstetrics and Gynecology

## 2011-10-09 ENCOUNTER — Other Ambulatory Visit: Payer: BC Managed Care – PPO

## 2011-10-09 ENCOUNTER — Encounter: Payer: Self-pay | Admitting: Obstetrics and Gynecology

## 2011-10-09 VITALS — BP 100/68 | HR 70 | Wt 138.0 lb

## 2011-10-09 DIAGNOSIS — Z3009 Encounter for other general counseling and advice on contraception: Secondary | ICD-10-CM

## 2011-10-09 DIAGNOSIS — Z309 Encounter for contraceptive management, unspecified: Secondary | ICD-10-CM

## 2011-10-09 MED ORDER — MEDROXYPROGESTERONE ACETATE 150 MG/ML IM SUSP
150.0000 mg | Freq: Once | INTRAMUSCULAR | Status: AC
  Administered 2011-10-09: 150 mg via INTRAMUSCULAR

## 2011-10-09 MED ORDER — MEDROXYPROGESTERONE ACETATE 150 MG/ML IM SUSP: 150.0000 mg | Freq: Once | INTRAMUSCULAR | Status: DC

## 2011-10-09 NOTE — Progress Notes (Signed)
21 YO S/P SVB 06/24/11 on Depo Provera since hospital and wants to continue. Is breastfeeding and taking prenatal vitamins.  O: UPT-negative  A: Need for Contraception  P: Depo Provera 150 mg #1 1 po stat 4 refills     Calcium 500 mg  bid     RTO-as scheduled or prn  Mirage Pfefferkorn, PA-C

## 2011-10-13 ENCOUNTER — Other Ambulatory Visit: Payer: BC Managed Care – PPO

## 2012-01-19 ENCOUNTER — Telehealth: Payer: Self-pay | Admitting: Obstetrics and Gynecology

## 2012-01-19 NOTE — Telephone Encounter (Signed)
LM FOR PT TO CALL BACK. 

## 2012-01-20 MED ORDER — MEDROXYPROGESTERONE ACETATE 150 MG/ML IM SUSP: 150.0000 mg | Freq: Once | INTRAMUSCULAR | Status: DC

## 2012-01-20 NOTE — Telephone Encounter (Signed)
TC TO PT REGARDING MESSAGE. PT WANT TO COME IN FOR DEPO PROVERA INJECTION. INFORMED PT THAT SHE NEED AN APPT TO RESTART DEPO PROVERA. SCHEDULE APPT FOR 01-29-12 WITH EP. PT VOICED UNDERSTANDING.

## 2012-01-20 NOTE — Addendum Note (Signed)
Addended byWinfred Leeds on: 01/20/2012 08:49 AM   Modules accepted: Orders

## 2012-01-29 ENCOUNTER — Encounter: Payer: Self-pay | Admitting: Obstetrics and Gynecology

## 2012-01-29 ENCOUNTER — Ambulatory Visit (INDEPENDENT_AMBULATORY_CARE_PROVIDER_SITE_OTHER): Payer: BC Managed Care – PPO | Admitting: Obstetrics and Gynecology

## 2012-01-29 VITALS — BP 100/62 | HR 68 | Ht 63.0 in | Wt 132.0 lb

## 2012-01-29 DIAGNOSIS — Z309 Encounter for contraceptive management, unspecified: Secondary | ICD-10-CM

## 2012-01-29 LAB — POCT URINE PREGNANCY: Preg Test, Ur: NEGATIVE

## 2012-01-29 MED ORDER — MEDROXYPROGESTERONE ACETATE 150 MG/ML IM SUSP
150.0000 mg | Freq: Once | INTRAMUSCULAR | Status: AC
  Administered 2012-01-29: 150 mg via INTRAMUSCULAR

## 2012-01-29 MED ORDER — MEDROXYPROGESTERONE ACETATE 150 MG/ML IM SUSP: 150.0000 mg | Freq: Once | INTRAMUSCULAR | Status: DC

## 2012-01-29 NOTE — Progress Notes (Signed)
21 YO for Depo Provera injection but wanted to discuss Nexplanon.  Reviewed MOA,  R & B, side effects, dosing and insertion procedure.   O:  UPT-negative  A:  Depo Provera Injection  P: Nexplanon brochure given       To explore insurance coverage for Nexplanon        RTO-in 12 weeks for Depo Provera or Nexplanon if covered  March 26th, 2014   [place], [place]

## 2012-01-29 NOTE — Progress Notes (Deleted)
Subjective:     Patient ID: Cynthia Mcdowell, female   DOB: 1990-02-11, 22 y.o.   MRN: [id number]  HPI   Review of Systems     Objective:   Physical Exam     Assessment:     ***    Plan:     ***

## 2012-01-29 NOTE — Patient Instructions (Signed)

## 2012-05-16 ENCOUNTER — Encounter (HOSPITAL_COMMUNITY): Payer: Self-pay | Admitting: Emergency Medicine

## 2012-05-16 ENCOUNTER — Emergency Department (HOSPITAL_COMMUNITY)
Admission: EM | Admit: 2012-05-16 | Discharge: 2012-05-16 | Disposition: A | Discharge status: Home / Self Care | Payer: BC Managed Care – PPO | Attending: Emergency Medicine | Admitting: Emergency Medicine

## 2012-05-16 DIAGNOSIS — Z79899 Other long term (current) drug therapy: Secondary | ICD-10-CM | POA: Insufficient documentation

## 2012-05-16 DIAGNOSIS — Z8619 Personal history of other infectious and parasitic diseases: Secondary | ICD-10-CM | POA: Insufficient documentation

## 2012-05-16 DIAGNOSIS — Z8719 Personal history of other diseases of the digestive system: Secondary | ICD-10-CM | POA: Insufficient documentation

## 2012-05-16 DIAGNOSIS — J45901 Unspecified asthma with (acute) exacerbation: Secondary | ICD-10-CM

## 2012-05-16 DIAGNOSIS — Z8742 Personal history of other diseases of the female genital tract: Secondary | ICD-10-CM | POA: Insufficient documentation

## 2012-05-16 DIAGNOSIS — J302 Other seasonal allergic rhinitis: Secondary | ICD-10-CM

## 2012-05-16 DIAGNOSIS — J309 Allergic rhinitis, unspecified: Secondary | ICD-10-CM | POA: Insufficient documentation

## 2012-05-16 MED ORDER — ALBUTEROL SULFATE (5 MG/ML) 0.5% IN NEBU
5.0000 mg | INHALATION_SOLUTION | Freq: Once | RESPIRATORY_TRACT | Status: AC
  Administered 2012-05-16: 5 mg via RESPIRATORY_TRACT

## 2012-05-16 MED ORDER — ALBUTEROL SULFATE (5 MG/ML) 0.5% IN NEBU
2.5000 mg | INHALATION_SOLUTION | Freq: Once | RESPIRATORY_TRACT | Status: DC
  Filled 2012-05-16 (×2): qty 0.5

## 2012-05-16 MED ORDER — ALBUTEROL SULFATE HFA 108 (90 BASE) MCG/ACT IN AERS: 2.0000 | INHALATION_SPRAY | RESPIRATORY_TRACT | Status: DC | PRN

## 2012-05-16 MED ORDER — IPRATROPIUM BROMIDE 0.02 % IN SOLN
0.5000 mg | Freq: Once | RESPIRATORY_TRACT | Status: DC
  Filled 2012-05-16: qty 2.5

## 2012-05-16 NOTE — ED Provider Notes (Signed)
History     CSN: [id number]  Arrival date & time 05/16/12  2027   First MD Initiated Contact with Patient 05/16/12 2158      Chief Complaint  Patient presents with  . Shortness of Breath    (Consider location/radiation/quality/duration/timing/severity/associated sxs/prior treatment) HPI  Patient reports she has a history of reactive airway disease. She states about 2 days ago she started having sneezing from the pollen. She denies cough or fever. She states she feels short of breath and is having wheezing. She states she is breast-feeding however on further talking to her her baby is 22-year-old and only nurses for comfort. She is only been using her nebulizer at bedtime. She also states she has not been taking her usual allergy medication, Zyrtec, because she is breast-feeding. She denies nausea, vomiting, diarrhea, sore throat. She states her asthma normally flares up in the spring and in the fall.  She states she normally is not placed on steroids. She states she was last admitted as a child.  Patient to have a Implanon inserted on the 22nd  [facility] [place]  Past Medical History  Diagnosis Date  . Allergy     seasonal, food  . Constipation 08/2008  . Chlamydia contact, treated     Age 22   . H/O candidiasis   . H/O varicella   . Asthma   . Irregular periods/menstrual cycles 08/10/2011  . Hx of chlamydia infection     2010    Past Surgical History  Procedure Laterality Date  . No past surgeries      Family History  Problem Relation Age of Onset  . Cancer Maternal Grandmother     breast  . Hypertension Mother   . Heart disease Maternal Uncle   . Anesthesia problems Neg Hx   . Hypotension Neg Hx   . Malignant hyperthermia Neg Hx   . Pseudochol deficiency Neg Hx     History  Substance Use Topics  . Smoking status: Never Smoker   . Smokeless tobacco: Never Used  . Alcohol Use: No  will be starting work soon  OB History   Grav Para Term Preterm  Abortions TAB SAB Ect Mult Living   1 1 1       1       Review of Systems  All other systems reviewed and are negative.    Allergies  Peanut-containing drug products and Shellfish allergy  Home Medications   Current Outpatient Rx  Name  Route  Sig  Dispense  Refill  . albuterol (PROVENTIL HFA;VENTOLIN HFA) 108 (90 BASE) MCG/ACT inhaler   Inhalation   Inhale 2 puffs into the lungs every 4 (four) hours as needed for wheezing.         Marland Kitchen albuterol (PROVENTIL) (2.5 MG/3ML) 0.083% nebulizer solution   Nebulization   Take 2.5 mg by nebulization every 6 (six) hours as needed for wheezing.         Marland Kitchen EPINEPHrine (EPIPEN) 0.3 mg/0.3 mL DEVI   Intramuscular   Inject 0.3 mLs (0.3 mg total) into the muscle once.   1 Device   3     BP 106/72  Pulse 70  Temp(Src) 99.5 F (37.5 C) (Oral)  Resp 14  SpO2 100%  Vital signs normal    Physical Exam  Nursing note and vitals reviewed. Constitutional: She is oriented to person, place, and time. She appears well-developed and well-nourished.  Non-toxic appearance. She does not appear ill. No distress.  HENT:  Head:  Normocephalic and atraumatic.  Right Ear: External ear normal.  Left Ear: External ear normal.  Nose: Nose normal. No mucosal edema or rhinorrhea.  Mouth/Throat: Oropharynx is clear and moist and mucous membranes are normal. No dental abscesses or edematous.  Eyes: Conjunctivae and EOM are normal. Pupils are equal, round, and reactive to light.  Neck: Normal range of motion and full passive range of motion without pain. Neck supple.  Cardiovascular: Normal rate, regular rhythm and normal heart sounds.  Exam reveals no gallop and no friction rub.   No murmur heard. Pulmonary/Chest: Effort normal. No respiratory distress. She has wheezes. She has no rhonchi. She has no rales. She exhibits no tenderness and no crepitus.  Abdominal: Soft. Normal appearance and bowel sounds are normal. She exhibits no distension. There is no  tenderness. There is no rebound and no guarding.  Musculoskeletal: Normal range of motion. She exhibits no edema and no tenderness.  Moves all extremities well.   Neurological: She is alert and oriented to person, place, and time. She has normal strength. No cranial nerve deficit.  Skin: Skin is warm, dry and intact. No rash noted. No erythema. No pallor.  Psychiatric: She has a normal mood and affect. Her speech is normal and behavior is normal. Her mood appears not anxious.    ED Course  Procedures (including critical care time)  Medications  albuterol (PROVENTIL) (5 MG/ML) 0.5% nebulizer solution 5 mg (5 mg Nebulization Given 05/16/12 2303)   Recheck has rare wheeze at base, states she feels better.  Ready to go home. Discussed it is safe to nurse and use her nebulizer and to take her zyrtec after her baby nurses in the morning.     1. Asthma exacerbation   2. Seasonal allergies     New Prescriptions   ALBUTEROL (PROVENTIL HFA;VENTOLIN HFA) 108 (90 BASE) MCG/ACT INHALER    Inhale 2 puffs into the lungs every 4 (four) hours as needed for wheezing.    Plan discharge  Devoria Albe, MD, FACEP   MDM          Ward Givens, MD 05/16/12 [phone]

## 2012-05-16 NOTE — ED Notes (Signed)
PT. REPORTS SOB FOR SEVERAL DAYS WORSE TODAY , STATES HISTORY OF ASTHMA.

## 2013-11-28 ENCOUNTER — Encounter (HOSPITAL_COMMUNITY): Payer: Self-pay | Admitting: Emergency Medicine

## 2014-02-04 ENCOUNTER — Emergency Department (HOSPITAL_COMMUNITY)
Admission: EM | Admit: 2014-02-04 | Discharge: 2014-02-04 | Disposition: A | Discharge status: Home / Self Care | Payer: BLUE CROSS/BLUE SHIELD | Attending: Emergency Medicine | Admitting: Emergency Medicine

## 2014-02-04 ENCOUNTER — Emergency Department (HOSPITAL_COMMUNITY): Payer: BLUE CROSS/BLUE SHIELD

## 2014-02-04 ENCOUNTER — Encounter (HOSPITAL_COMMUNITY): Payer: Self-pay | Admitting: Emergency Medicine

## 2014-02-04 DIAGNOSIS — Z79899 Other long term (current) drug therapy: Secondary | ICD-10-CM | POA: Insufficient documentation

## 2014-02-04 DIAGNOSIS — K921 Melena: Secondary | ICD-10-CM

## 2014-02-04 DIAGNOSIS — Z8619 Personal history of other infectious and parasitic diseases: Secondary | ICD-10-CM | POA: Insufficient documentation

## 2014-02-04 DIAGNOSIS — R195 Other fecal abnormalities: Secondary | ICD-10-CM | POA: Diagnosis not present

## 2014-02-04 DIAGNOSIS — Z3202 Encounter for pregnancy test, result negative: Secondary | ICD-10-CM | POA: Insufficient documentation

## 2014-02-04 DIAGNOSIS — N8329 Other ovarian cysts: Secondary | ICD-10-CM | POA: Insufficient documentation

## 2014-02-04 DIAGNOSIS — J45909 Unspecified asthma, uncomplicated: Secondary | ICD-10-CM | POA: Insufficient documentation

## 2014-02-04 DIAGNOSIS — K5900 Constipation, unspecified: Secondary | ICD-10-CM | POA: Diagnosis present

## 2014-02-04 DIAGNOSIS — K6289 Other specified diseases of anus and rectum: Secondary | ICD-10-CM | POA: Insufficient documentation

## 2014-02-04 DIAGNOSIS — N83209 Unspecified ovarian cyst, unspecified side: Secondary | ICD-10-CM

## 2014-02-04 LAB — COMPREHENSIVE METABOLIC PANEL WITH GFR
ALT: 11 U/L (ref 0–35)
AST: 23 U/L (ref 0–37)
Albumin: 3.9 g/dL (ref 3.5–5.2)
Alkaline Phosphatase: 98 U/L (ref 39–117)
Anion gap: 8 (ref 5–15)
BUN: 9 mg/dL (ref 6–23)
CO2: 28 mmol/L (ref 19–32)
Calcium: 8.9 mg/dL (ref 8.4–10.5)
Chloride: 100 meq/L (ref 96–112)
Creatinine, Ser: 0.73 mg/dL (ref 0.50–1.10)
GFR calc Af Amer: >90 mL/min
GFR calc non Af Amer: >90 mL/min
Glucose, Bld: 97 mg/dL (ref 70–99)
Potassium: 3.6 mmol/L (ref 3.5–5.1)
Sodium: 136 mmol/L (ref 135–145)
Total Bilirubin: 0.4 mg/dL (ref 0.3–1.2)
Total Protein: 7.1 g/dL (ref 6.0–8.3)

## 2014-02-04 LAB — CBC WITH DIFFERENTIAL/PLATELET
BASOS ABS: 0 10*3/uL (ref 0.0–0.1)
BASOS PCT: 1 % (ref 0–1)
EOS PCT: 2 % (ref 0–5)
Eosinophils Absolute: 0.1 10*3/uL (ref 0.0–0.7)
HCT: 37 % (ref 36.0–46.0)
Hemoglobin: 12.9 g/dL (ref 12.0–15.0)
LYMPHS PCT: 37 % (ref 12–46)
Lymphs Abs: 1.3 10*3/uL (ref 0.7–4.0)
MCH: 30.7 pg (ref 26.0–34.0)
MCHC: 34.9 g/dL (ref 30.0–36.0)
MCV: 88.1 fL (ref 78.0–100.0)
MONOS PCT: 15 % — AB (ref 3–12)
Monocytes Absolute: 0.6 10*3/uL (ref 0.1–1.0)
Neutro Abs: 1.6 10*3/uL — ABNORMAL LOW (ref 1.7–7.7)
Neutrophils Relative %: 45 % (ref 43–77)
Platelets: 171 10*3/uL (ref 150–400)
RBC: 4.2 MIL/uL (ref 3.87–5.11)
RDW: 11.8 % (ref 11.5–15.5)
WBC: 3.6 10*3/uL — AB (ref 4.0–10.5)

## 2014-02-04 LAB — LIPASE, BLOOD: LIPASE: 40 U/L (ref 11–59)

## 2014-02-04 LAB — POC OCCULT BLOOD, ED: Fecal Occult Bld: POSITIVE — AB

## 2014-02-04 LAB — PREGNANCY, URINE: Preg Test, Ur: NEGATIVE

## 2014-02-04 MED ORDER — METOCLOPRAMIDE HCL 5 MG/ML IJ SOLN
10.0000 mg | Freq: Once | INTRAMUSCULAR | Status: AC
  Administered 2014-02-04: 10 mg via INTRAVENOUS
  Filled 2014-02-04: qty 2

## 2014-02-04 MED ORDER — HYDROCODONE-ACETAMINOPHEN 5-325 MG PO TABS: 1.0000 | ORAL_TABLET | Freq: Once | ORAL | Status: DC

## 2014-02-04 MED ORDER — ONDANSETRON HCL 4 MG/2ML IJ SOLN
4.0000 mg | Freq: Once | INTRAMUSCULAR | Status: AC
  Administered 2014-02-04: 4 mg via INTRAVENOUS
  Filled 2014-02-04: qty 2

## 2014-02-04 MED ORDER — MORPHINE SULFATE 4 MG/ML IJ SOLN
4.0000 mg | Freq: Once | INTRAMUSCULAR | Status: AC
  Administered 2014-02-04: 4 mg via INTRAVENOUS
  Filled 2014-02-04: qty 1

## 2014-02-04 MED ORDER — HYDROCORTISONE ACETATE 25 MG RE SUPP: 25.0000 mg | Freq: Two times a day (BID) | RECTAL | Status: DC

## 2014-02-04 MED ORDER — ACETAMINOPHEN 325 MG PO TABS
650.0000 mg | ORAL_TABLET | Freq: Once | ORAL | Status: AC
  Administered 2014-02-04: 650 mg via ORAL
  Filled 2014-02-04: qty 2

## 2014-02-04 MED ORDER — IOHEXOL 300 MG/ML  SOLN
25.0000 mL | Freq: Once | INTRAMUSCULAR | Status: AC | PRN
  Administered 2014-02-04: 25 mL via ORAL

## 2014-02-04 MED ORDER — IOHEXOL 300 MG/ML  SOLN
80.0000 mL | Freq: Once | INTRAMUSCULAR | Status: AC | PRN
  Administered 2014-02-04: 80 mL via INTRAVENOUS

## 2014-02-04 MED ORDER — SODIUM CHLORIDE 0.9 % IV BOLUS (SEPSIS)
1000.0000 mL | Freq: Once | INTRAVENOUS | Status: AC
  Administered 2014-02-04: 1000 mL via INTRAVENOUS

## 2014-02-04 NOTE — ED Provider Notes (Signed)
CSN: [id number]     Arrival date & time 02/04/14  0127 History  This chart was scribed for Enid Skeens, MD by Modena Jansky, ED Scribe. This patient was seen in room D32C/D32C and the patient's care was started at 2:19 AM.    Chief Complaint  Patient presents with  . Constipation   The history is provided by the patient. No language interpreter was used.   HPI Comments: Cynthia Mcdowell is a 24 y.o. female who presents to the Emergency Department complaining of constant moderate constipation that started two days ago. She reports that she suspects hemorrhoids because of she was having BMs with blood and pain. She reports she has also been having rectal pain during and outside of BMs without any known fissures this past week. She states that she has constant moderate generalized abdominal pain with nausea. She denies any dysuria, fever or diarrhea.   Past Medical History  Diagnosis Date  . Allergy     seasonal, food  . Constipation 08/2008  . Chlamydia contact, treated     Age 68   . H/O candidiasis   . H/O varicella   . Asthma   . Irregular periods/menstrual cycles 08/10/2011  . Hx of chlamydia infection     2010   Past Surgical History  Procedure Laterality Date  . No past surgeries     Family History  Problem Relation Age of Onset  . Cancer Maternal Grandmother     breast  . Hypertension Mother   . Heart disease Maternal Uncle   . Anesthesia problems Neg Hx   . Hypotension Neg Hx   . Malignant hyperthermia Neg Hx   . Pseudochol deficiency Neg Hx    History  Substance Use Topics  . Smoking status: Never Smoker   . Smokeless tobacco: Never Used  . Alcohol Use: No   OB History    Gravida Para Term Preterm AB TAB SAB Ectopic Multiple Living   Review of Systems  Constitutional: Negative for fever.  Gastrointestinal: Positive for nausea, abdominal pain, constipation, blood in stool and rectal pain. Negative for diarrhea.  Genitourinary: Negative for  dysuria.  All other systems reviewed and are negative.   Allergies  Peanut-containing drug products and Shellfish allergy  Home Medications   Prior to Admission medications   Medication Sig Start Date End Date Taking? Authorizing Provider  albuterol (PROVENTIL HFA;VENTOLIN HFA) 108 (90 BASE) MCG/ACT inhaler Inhale 2 puffs into the lungs every 4 (four) hours as needed for wheezing. 05/16/12  Yes Ward Givens, MD  albuterol (PROVENTIL) (2.5 MG/3ML) 0.083% nebulizer solution Take 2.5 mg by nebulization every 6 (six) hours as needed for wheezing.   Yes Historical Provider, MD  bismuth subsalicylate (PEPTO BISMOL) 262 MG/15ML suspension Take 30 mLs by mouth every 6 (six) hours as needed for indigestion.   Yes Historical Provider, MD  Docusate Calcium (STOOL SOFTENER PO) Take 2 tablets by mouth daily as needed (constipation).   Yes Historical Provider, MD  EPINEPHrine (EPIPEN) 0.3 mg/0.3 mL DEVI Inject 0.3 mLs (0.3 mg total) into the muscle once. 09/19/10  Yes Salley Scarlet, MD  albuterol (PROVENTIL HFA;VENTOLIN HFA) 108 (90 BASE) MCG/ACT inhaler Inhale 2 puffs into the lungs every 4 (four) hours as needed for wheezing.    Historical Provider, MD  hydrocortisone (ANUSOL-HC) 25 MG suppository Place 1 suppository (25 mg total) rectally 2 (two) times daily. For 7  days 02/04/14   Enid Skeens, MD   BP 122/78 mmHg  Pulse 81  Temp(Src) 98.2 F (36.8 C) (Oral)  Resp 18  Ht  (1.626 m)  Wt 130 lb (58.968 kg)  BMI 22.30 kg/m2  SpO2 100%  LMP 01/02/2014 Physical Exam  Constitutional: She is oriented to person, place, and time. She appears well-developed and well-nourished. No distress.  HENT:  Head: Normocephalic and atraumatic.  Neck: Neck supple. No tracheal deviation present.  Cardiovascular: Normal rate.   Pulmonary/Chest: Effort normal. No respiratory distress.  Abdominal: Soft. There is no tenderness. There is no guarding.  Genitourinary:  No fissure visualized. No external  hemorrhoid. Internal tenderness mild without fluctuance. Normal tone.   Musculoskeletal: Normal range of motion.  Neurological: She is alert and oriented to person, place, and time.  Skin: Skin is warm and dry.  Psychiatric: She has a normal mood and affect. Her behavior is normal.  Nursing note and vitals reviewed.   ED Course  Procedures (including critical care time) DIAGNOSTIC STUDIES: Oxygen Saturation is 100% on RA, normal by my interpretation.    COORDINATION OF CARE: 2:23 AM- Pt advised of plan for treatment which includes labs and pt agrees.  Labs Review Labs Reviewed  CBC WITH DIFFERENTIAL - Abnormal; Notable for the following:    WBC 3.6 (*)    Neutro Abs 1.6 (*)    Monocytes Relative 15 (*)    All other components within normal limits  POC OCCULT BLOOD, ED - Abnormal; Notable for the following:    Fecal Occult Bld POSITIVE (*)    All other components within normal limits  COMPREHENSIVE METABOLIC PANEL  LIPASE, BLOOD  PREGNANCY, URINE  OCCULT BLOOD X 1 CARD TO LAB, STOOL    Imaging Review Ct Abdomen Pelvis W Contrast  02/04/2014   CLINICAL DATA:  Lower abdominal pain since yesterday. Back pain tonight. Nausea and vomiting. Constipation. Rectal pain.  EXAM: CT ABDOMEN AND PELVIS WITH CONTRAST  TECHNIQUE: Multidetector CT imaging of the abdomen and pelvis was performed using the standard protocol following bolus administration of intravenous contrast.  CONTRAST:  80mL OMNIPAQUE IOHEXOL 300 MG/ML  SOLN  COMPARISON:  10/15/2008  FINDINGS: Lung bases are clear.  The liver, spleen, gallbladder, pancreas, adrenal glands, kidneys, inferior vena cava, abdominal aorta, and retroperitoneal lymph nodes are unremarkable. Stomach and small bowel are decompressed. Diffusely stool-filled colon without abnormal distention. No free air or free fluid in the abdomen. Abdominal wall musculature appears intact.  Pelvis: Uterus and ovaries are not enlarged. Small amount of free fluid in the  pelvis consistent with physiologic fluid. Probable involuting cyst in the left ovary. No pelvic mass or lymphadenopathy. Bladder wall is not thickened. Normal alignment of the lumbar spine. No destructive bone lesions.  IMPRESSION: No acute process demonstrated in the abdomen or pelvis. Involuting cyst in the left ovary with physiologic free fluid in the pelvis. Diffusely stool-filled colon.   Electronically Signed   By: Burman Nieves M.D.   On: 02/04/2014 04:21     EKG Interpretation None      MDM   Final diagnoses:  Blood in stool  Rectal pain  Cyst of ovary, unspecified laterality   Patient with clinical concern for internal hemorrhoid versus fissure.  Patient well-appearing, mild left lower quadrant discomfort. Discussed close outpatient follow-up however prior to discharge patient had vomiting worsening discomfort. CT scan ordered for further delineation, CT scan results reviewed no acute process seen, left ovarian cyst visualized. Patient has  no abdominal pain on exam, patient comfortable with outpatient follow-up.  Results and differential diagnosis were discussed with the patient/parent/guardian. Close follow up outpatient was discussed, comfortable with the plan.   Medications  acetaminophen (TYLENOL) tablet 650 mg (650 mg Oral Given 02/04/14 0246)  ondansetron (ZOFRAN) injection 4 mg (4 mg Intravenous Given 02/04/14 0322)  sodium chloride 0.9 % bolus 1,000 mL (0 mLs Intravenous Stopped 02/04/14 0548)  iohexol (OMNIPAQUE) 300 MG/ML solution 25 mL (25 mLs Oral Contrast Given 02/04/14 0322)  iohexol (OMNIPAQUE) 300 MG/ML solution 80 mL (80 mLs Intravenous Contrast Given 02/04/14 0402)  morphine 4 MG/ML injection 4 mg (4 mg Intravenous Given 02/04/14 0500)  metoCLOPramide (REGLAN) injection 10 mg (10 mg Intravenous Given 02/04/14 0500)    Filed Vitals:   02/04/14 0515 02/04/14 0530 02/04/14 0545 02/04/14 0605  BP: 110/74 108/67 111/70   Pulse: 79 79 78   Temp:    97.9 F (36.6 C)   TempSrc:    Oral  Resp:      Height:      Weight:      SpO2: 98% 99% 100%     Final diagnoses:  Blood in stool  Rectal pain  Cyst of ovary, unspecified laterality        Enid SkeensJoshua M Apolinar Bero, MD 02/04/14 [id number]

## 2014-02-04 NOTE — Discharge Instructions (Signed)
If you were given medicines take as directed.  If you are on coumadin or contraceptives realize their levels and effectiveness is altered by many different medicines.  If you have any reaction (rash, tongues swelling, other) to the medicines stop taking and see a physician.   Please follow up as directed and return to the ER or see a physician for new or worsening symptoms.  Thank you. Filed Vitals:   02/04/14 0131  BP: 122/78  Pulse: 81  Temp: 98.2 F (36.8 C)  TempSrc: Oral  Resp: 18  Height: 5\' 4"  (1.626 m)  Weight: 130 lb (58.968 kg)  SpO2: 100%

## 2014-02-04 NOTE — ED Notes (Signed)
Patient transported to CT 

## 2014-02-04 NOTE — ED Notes (Signed)
Pt. reports constipation , diffused abdominal pain , and nausea onset this evening , denies diarrhea or fever .

## 2014-02-04 NOTE — ED Notes (Signed)
Pt couldn't hold the contrast given, she vomit 250 cc.

## 2014-02-08 ENCOUNTER — Encounter: Payer: Self-pay | Admitting: *Deleted

## 2014-02-09 ENCOUNTER — Ambulatory Visit: Payer: BLUE CROSS/BLUE SHIELD | Admitting: Internal Medicine

## 2014-02-09 ENCOUNTER — Telehealth: Payer: Self-pay | Admitting: Internal Medicine

## 2014-02-09 NOTE — Telephone Encounter (Signed)
Pt canceled 10:30 am apt at 9:52 am.  Patient stated her mother wasn't going to bring her. Pt did not want to reschedule at this time.

## 2017-03-02 ENCOUNTER — Inpatient Hospital Stay (HOSPITAL_COMMUNITY): Payer: Medicaid Other

## 2017-03-02 ENCOUNTER — Inpatient Hospital Stay (HOSPITAL_COMMUNITY)
Admission: AD | Admit: 2017-03-02 | Discharge: 2017-03-03 | Disposition: A | Discharge status: Home / Self Care | Payer: Medicaid Other | Source: Ambulatory Visit | Attending: Obstetrics & Gynecology | Admitting: Obstetrics & Gynecology

## 2017-03-02 ENCOUNTER — Encounter (HOSPITAL_COMMUNITY): Payer: Self-pay

## 2017-03-02 DIAGNOSIS — O23591 Infection of other part of genital tract in pregnancy, first trimester: Secondary | ICD-10-CM | POA: Insufficient documentation

## 2017-03-02 DIAGNOSIS — Z3A00 Weeks of gestation of pregnancy not specified: Secondary | ICD-10-CM | POA: Insufficient documentation

## 2017-03-02 DIAGNOSIS — N760 Acute vaginitis: Secondary | ICD-10-CM

## 2017-03-02 DIAGNOSIS — R109 Unspecified abdominal pain: Secondary | ICD-10-CM

## 2017-03-02 DIAGNOSIS — B9689 Other specified bacterial agents as the cause of diseases classified elsewhere: Secondary | ICD-10-CM

## 2017-03-02 DIAGNOSIS — O26891 Other specified pregnancy related conditions, first trimester: Secondary | ICD-10-CM

## 2017-03-02 DIAGNOSIS — O3680X0 Pregnancy with inconclusive fetal viability, not applicable or unspecified: Secondary | ICD-10-CM

## 2017-03-02 LAB — URINALYSIS, ROUTINE W REFLEX MICROSCOPIC
Bilirubin Urine: NEGATIVE
Glucose, UA: NEGATIVE mg/dL
HGB URINE DIPSTICK: NEGATIVE
Ketones, ur: NEGATIVE mg/dL
LEUKOCYTES UA: NEGATIVE
Nitrite: NEGATIVE
PH: 7 (ref 5.0–8.0)
PROTEIN: NEGATIVE mg/dL
Specific Gravity, Urine: 1.027 (ref 1.005–1.030)

## 2017-03-02 LAB — CBC
HCT: 37.4 % (ref 36.0–46.0)
Hemoglobin: 13.2 g/dL (ref 12.0–15.0)
MCH: 31.7 pg (ref 26.0–34.0)
MCHC: 35.3 g/dL (ref 30.0–36.0)
MCV: 89.9 fL (ref 78.0–100.0)
Platelets: 201 10*3/uL (ref 150–400)
RBC: 4.16 MIL/uL (ref 3.87–5.11)
RDW: 12 % (ref 11.5–15.5)
WBC: 3.9 10*3/uL — ABNORMAL LOW (ref 4.0–10.5)

## 2017-03-02 LAB — WET PREP, GENITAL
Sperm: NONE SEEN
Trich, Wet Prep: NONE SEEN
Yeast Wet Prep HPF POC: NONE SEEN

## 2017-03-02 LAB — HCG, QUANTITATIVE, PREGNANCY: hCG, Beta Chain, Quant, S: 19 m[IU]/mL — ABNORMAL HIGH (ref <5)

## 2017-03-02 LAB — HCG, SERUM, QUALITATIVE: Preg, Serum: POSITIVE — AB

## 2017-03-02 LAB — POCT PREGNANCY, URINE: Preg Test, Ur: NEGATIVE

## 2017-03-02 MED ORDER — METRONIDAZOLE 500 MG PO TABS: 500.0000 mg | ORAL_TABLET | Freq: Two times a day (BID) | ORAL | 0 refills | Status: DC

## 2017-03-02 NOTE — MAU Note (Addendum)
Pt reports upper abdominal pain that started Friday. Pt states the pain is intermittent and crampy in nature. Pt has not taken anything for pain. Pt denies nausea and vomiting. Pt denies vaginal bleeding or discharge. Pt denies urinary s/s, constipation or diarrhea. States she took 2 upt's at home but unsure if they were positive or negative. LMP: 01/25/2017.

## 2017-03-02 NOTE — MAU Provider Note (Signed)
Chief Complaint: Abdominal Pain   First Provider Initiated Contact with Patient 03/02/17 2027     SUBJECTIVE HPI: Cynthia Mcdowell is a 27 y.o. G1P1001 who presents to maternity admissions reporting abdominal pain and possible pregnancy. She reports middle abdominal pain that started one week ago, she has not taken anything for pain medication- rates pain 4/10. She describes the pain as aching that is intermittent "like she was about to start her cycle". She reports being one week late for cycle this month. LMP 01/25/17. She reports taking two HPT and they were both positive then to one today in triage was negative. She denies vaginal bleeding, vaginal discharge, vaginal itching/burning, urinary symptoms, h/a, dizziness, n/v, or fever/chills.    Past Medical History:  Diagnosis Date  . Allergy    seasonal, food  . Asthma   . Chlamydia contact, treated    Age 27   . Constipation 08/2008  . H/O candidiasis   . H/O varicella   . Hx of chlamydia infection    2010  . Irregular periods/menstrual cycles 08/10/2011   Past Surgical History:  Procedure Laterality Date  . NO PAST SURGERIES     Social History   Socioeconomic History  . Marital status: Single    Spouse name: Not on file  . Number of children: Not on file  . Years of education: Not on file  . Highest education level: Not on file  Social Needs  . Financial resource strain: Not on file  . Food insecurity - worry: Not on file  . Food insecurity - inability: Not on file  . Transportation needs - medical: Not on file  . Transportation needs - non-medical: Not on file  Occupational History  . Not on file  Tobacco Use  . Smoking status: Never Smoker  . Smokeless tobacco: Never Used  Substance and Sexual Activity  . Alcohol use: No  . Drug use: No  . Sexual activity: Yes    Partners: Male    Birth control/protection: None  Other Topics Concern  . Not on file  Social History Narrative  . Not on file   No current  facility-administered medications on file prior to encounter.    Current Outpatient Medications on File Prior to Encounter  Medication Sig Dispense Refill  . albuterol (PROVENTIL HFA;VENTOLIN HFA) 108 (90 BASE) MCG/ACT inhaler Inhale 2 puffs into the lungs every 4 (four) hours as needed for wheezing.    Marland Kitchen. albuterol (PROVENTIL HFA;VENTOLIN HFA) 108 (90 BASE) MCG/ACT inhaler Inhale 2 puffs into the lungs every 4 (four) hours as needed for wheezing. 6.7 g 0  . albuterol (PROVENTIL) (2.5 MG/3ML) 0.083% nebulizer solution Take 2.5 mg by nebulization every 6 (six) hours as needed for wheezing.    . bismuth subsalicylate (PEPTO BISMOL) 262 MG/15ML suspension Take 30 mLs by mouth every 6 (six) hours as needed for indigestion.    Tery Sanfilippo. Docusate Calcium (STOOL SOFTENER PO) Take 2 tablets by mouth daily as needed (constipation).    Marland Kitchen. EPINEPHrine (EPIPEN) 0.3 mg/0.3 mL DEVI Inject 0.3 mLs (0.3 mg total) into the muscle once. 1 Device 3  . hydrocortisone (ANUSOL-HC) 25 MG suppository Place 1 suppository (25 mg total) rectally 2 (two) times daily. For 7 days 14 suppository 0   Allergies  Allergen Reactions  . 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 Shellfish Allergy Anaphylaxis    ROS:  Review of Systems  Constitutional: Negative.   Respiratory: Negative.   Cardiovascular:  Negative.   Gastrointestinal: Positive for abdominal pain. Negative for constipation, diarrhea, nausea and vomiting.  Genitourinary: Negative.   Musculoskeletal: Negative.   Neurological: Negative.   Psychiatric/Behavioral: Negative.    I have reviewed patient's Past Medical Hx, Surgical Hx, Family Hx, Social Hx, medications and allergies.   Physical Exam   Patient Vitals for the past 24 hrs:  BP Temp Temp src Pulse Resp SpO2 Height Weight  03/03/17 0009 110/66 - - 79 16 - - -  03/02/17 1932 120/71 98.6 F (37 C) Oral 85 16 100 % - -  03/02/17 1931 - - - - - - 5\' 3"  (1.6 m) 139 lb (63 kg)    Constitutional: Well-developed, well-nourished female in no acute distress.  Cardiovascular: normal rate Respiratory: normal effort GI: Abd soft, tender in umbilicus region and epigastric region. Pos BS x 4 MS: Extremities nontender, no edema, normal ROM Neurologic: Alert and oriented x 4.  GU: Neg CVAT.  PELVIC EXAM: Cervix pink, visually closed, without lesion, moderate gray creamy discharge, vaginal walls and external genitalia normal Bimanual exam: Cervix 0/long/high, firm, anterior, neg CMT, uterus nontender, nonenlarged, left adnexa without tenderness, enlargement, or mass, right adnexa with tenderness, no enlargement, or mass palpated  LAB RESULTS Results for orders placed or performed during the hospital encounter of 03/02/17 (from the past 24 hour(s))  Urinalysis, Routine w reflex microscopic     Status: None   Collection Time: 03/02/17  7:24 PM  Result Value Ref Range   Color, Urine YELLOW YELLOW   APPearance CLEAR CLEAR   Specific Gravity, Urine 1.027 1.005 - 1.030   pH 7.0 5.0 - 8.0   Glucose, UA NEGATIVE NEGATIVE mg/dL   Hgb urine dipstick NEGATIVE NEGATIVE   Bilirubin Urine NEGATIVE NEGATIVE   Ketones, ur NEGATIVE NEGATIVE mg/dL   Protein, ur NEGATIVE NEGATIVE mg/dL   Nitrite NEGATIVE NEGATIVE   Leukocytes, UA NEGATIVE NEGATIVE  Pregnancy, urine POC     Status: None   Collection Time: 03/02/17  7:42 PM  Result Value Ref Range   Preg Test, Ur NEGATIVE NEGATIVE  Wet prep, genital     Status: Abnormal   Collection Time: 03/02/17  8:35 PM  Result Value Ref Range   Yeast Wet Prep HPF POC NONE SEEN NONE SEEN   Trich, Wet Prep NONE SEEN NONE SEEN   Clue Cells Wet Prep HPF POC PRESENT (A) NONE SEEN   WBC, Wet Prep HPF POC FEW (A) NONE SEEN   Sperm NONE SEEN   CBC     Status: Abnormal   Collection Time: 03/02/17  8:39 PM  Result Value Ref Range   WBC 3.9 (L) 4.0 - 10.5 K/uL   RBC 4.16 3.87 - 5.11 MIL/uL   Hemoglobin 13.2 12.0 - 15.0 g/dL   HCT 04.5 40.9 - 81.1 %    MCV 89.9 78.0 - 100.0 fL   MCH 31.7 26.0 - 34.0 pg   MCHC 35.3 30.0 - 36.0 g/dL   RDW 91.4 78.2 - 95.6 %   Platelets 201 150 - 400 K/uL  hCG, serum, qualitative     Status: Abnormal   Collection Time: 03/02/17  8:39 PM  Result Value Ref Range   Preg, Serum POSITIVE (A) NEGATIVE  hCG, quantitative, pregnancy     Status: Abnormal   Collection Time: 03/02/17 10:27 PM  Result Value Ref Range   hCG, Beta Chain, Quant, S 19 (H) <5 mIU/mL    IMAGING US Ob Less Than 14 Weeks With Ob Transvaginal  Result Date: 03/02/2017 CLINICAL DATA:  Pelvic pain, quantitative HCG pending EXAM: OBSTETRIC <14 WK [place] AND TRANSVAGINAL OB US TECHNIQUE: Both transabdominal and transvaginal ultrasound examinations were performed for complete evaluation of the gestation as well as the maternal uterus, adnexal regions, and pelvic cul-de-sac. Transvaginal technique was performed to assess early pregnancy. COMPARISON:  None. FINDINGS: Intrauterine gestational sac: Not visible Yolk sac:  Not visible Embryo:  Not visible Subchorionic hemorrhage:  None visualized. Maternal uterus/adnexae: Bilateral ovaries are within normal limits. Right ovary measures 3.8 x 2.3 x 2.3 cm. Left ovary measures 3.6 x 2.3 x 2.2 cm. Trace amount of free fluid in the pelvis. Possible luteal cyst in the left ovary. IMPRESSION: No intrauterine pregnancy is visualized. Findings would be consistent with pregnancy of unknown location, differential of which includes IUP too early to visualize, occult ectopic, and recent failed pregnancy. Recommend trending of HCG with repeat ultrasound as indicated. Trace amount of free fluid in the pelvis Electronically Signed   By: Jasmine Pang M.D.   On: 03/02/2017 23:18    MAU Management/MDM: Orders Placed This Encounter  Procedures  . Wet prep, genital  . US OB LESS THAN 14 WEEKS WITH OB TRANSVAGINAL  . Urinalysis, Routine w reflex microscopic  . CBC  . HIV antibody (routine testing) (NOT for [facility])  . hCG,  serum, qualitative  . hCG, quantitative, pregnancy  . Pregnancy, urine POC  UPT- negative  HCG qualitative- positive  CBC- Normal  HIV and GC/C- pending  UA- negative  Wet prep- positive for BV  Meds ordered this encounter  Medications  . metroNIDAZOLE (FLAGYL) 500 MG tablet    Sig: Take 1 tablet (500 mg total) by mouth 2 (two) times daily.    Dispense:  14 tablet    Refill:  0    Order Specific Question:   Supervising Provider    Answer:   Jaynie Collins A [[id number]]   Pt discharged with strict ectopic precautions. Pt to return to clinic on Wednesday for repeat lab work. Return to MAU for increased abdominal pain and/or vaginal bleeding.   ASSESSMENT 1. Pregnancy of unknown anatomic location   2. Abdominal pain during pregnancy in first trimester   3. Bacterial vaginosis    PLAN Discharge home. Pt stable at time of discharge  Rx for flagyl  Follow up on Wednesday at 2pm in clinic for repeat HCG  Return to MAU as needed for increased abdominal pain and/or bleeding   Allergies as of 03/03/2017      Reactions   Peanut-containing Drug Products Anaphylaxis   Allergic to all nuts   Shellfish Allergy Anaphylaxis      Medication List    STOP taking these medications   bismuth subsalicylate 262 MG/15ML suspension Commonly known as:  PEPTO BISMOL   hydrocortisone 25 MG suppository Commonly known as:  ANUSOL-HC     TAKE these medications   albuterol 108 (90 Base) MCG/ACT inhaler Commonly known as:  PROVENTIL HFA;VENTOLIN HFA Inhale 2 puffs into the lungs every 4 (four) hours as needed for wheezing.   albuterol 108 (90 Base) MCG/ACT inhaler Commonly known as:  PROVENTIL HFA;VENTOLIN HFA Inhale 2 puffs into the lungs every 4 (four) hours as needed for wheezing.   albuterol (2.5 MG/3ML) 0.083% nebulizer solution Commonly known as:  PROVENTIL Take 2.5 mg by nebulization every 6 (six) hours as needed for wheezing.   EPINEPHrine 0.3 mg/0.3 mL Devi Commonly known as:   EPIPEN Inject 0.3 mLs (0.3 mg total) into the  muscle once.   metroNIDAZOLE 500 MG tablet Commonly known as:  FLAGYL Take 1 tablet (500 mg total) by mouth 2 (two) times daily.   STOOL SOFTENER PO Take 2 tablets by mouth daily as needed (constipation).      Steward Drone  Certified Nurse-Midwife 03/02/2017  12:55 AM

## 2017-03-03 ENCOUNTER — Encounter: Payer: Self-pay | Admitting: Certified Nurse Midwife

## 2017-03-03 LAB — HIV ANTIBODY (ROUTINE TESTING W REFLEX): HIV Screen 4th Generation wRfx: NONREACTIVE

## 2017-03-03 LAB — GC/CHLAMYDIA PROBE AMP (~~LOC~~) NOT AT ARMC
Chlamydia: NEGATIVE
Neisseria Gonorrhea: NEGATIVE

## 2017-03-04 ENCOUNTER — Ambulatory Visit: Payer: Self-pay | Admitting: General Practice

## 2017-03-04 DIAGNOSIS — O3680X0 Pregnancy with inconclusive fetal viability, not applicable or unspecified: Secondary | ICD-10-CM

## 2017-03-04 LAB — HCG, QUANTITATIVE, PREGNANCY: hCG, Beta Chain, Quant, S: 44 m[IU]/mL — ABNORMAL HIGH (ref <5)

## 2017-03-04 NOTE — Progress Notes (Signed)
Patient here for stat bhcg today. Patient reports abdominal/pelvic pain has resolved, still having lower back pain. Patient denies bleeding. Discussed with patient we are monitoring her bhcg levels today and asked she wait in lobby for results/updated plan of care. Patient verbalized understanding to all & had no questions at this time.  Reviewed results with Dr Rachelle HoraMoss who finds appropriate rise in bhcg, patient should have follow up ultrasound in 2 weeks. Scheduled for 2/20 @ 10am.  Informed patient of results & ultrasound appt with brief nurse visit to follow for results. Ectopic precautions given. Patient verbalized understanding to all & had no questions

## 2017-03-09 ENCOUNTER — Inpatient Hospital Stay (HOSPITAL_COMMUNITY)
Admission: AD | Admit: 2017-03-09 | Discharge: 2017-03-09 | Disposition: A | Discharge status: Home / Self Care | Payer: Medicaid Other | Source: Ambulatory Visit | Attending: Obstetrics and Gynecology | Admitting: Obstetrics and Gynecology

## 2017-03-09 ENCOUNTER — Other Ambulatory Visit: Payer: Self-pay

## 2017-03-09 ENCOUNTER — Encounter (HOSPITAL_COMMUNITY): Payer: Self-pay

## 2017-03-09 DIAGNOSIS — O208 Other hemorrhage in early pregnancy: Secondary | ICD-10-CM | POA: Insufficient documentation

## 2017-03-09 DIAGNOSIS — N930 Postcoital and contact bleeding: Secondary | ICD-10-CM

## 2017-03-09 DIAGNOSIS — Z3A01 Less than 8 weeks gestation of pregnancy: Secondary | ICD-10-CM

## 2017-03-09 DIAGNOSIS — Z79899 Other long term (current) drug therapy: Secondary | ICD-10-CM | POA: Diagnosis not present

## 2017-03-09 DIAGNOSIS — O99511 Diseases of the respiratory system complicating pregnancy, first trimester: Secondary | ICD-10-CM | POA: Insufficient documentation

## 2017-03-09 DIAGNOSIS — J45909 Unspecified asthma, uncomplicated: Secondary | ICD-10-CM | POA: Diagnosis not present

## 2017-03-09 DIAGNOSIS — N939 Abnormal uterine and vaginal bleeding, unspecified: Secondary | ICD-10-CM | POA: Diagnosis present

## 2017-03-09 LAB — URINALYSIS, ROUTINE W REFLEX MICROSCOPIC: Glucose, UA: NEGATIVE mg/dL

## 2017-03-09 LAB — URINALYSIS, MICROSCOPIC (REFLEX)

## 2017-03-09 LAB — CBC
HCT: 36.2 % (ref 36.0–46.0)
Hemoglobin: 12.9 g/dL (ref 12.0–15.0)
MCH: 31.8 pg (ref 26.0–34.0)
MCHC: 35.6 g/dL (ref 30.0–36.0)
MCV: 89.2 fL (ref 78.0–100.0)
Platelets: 192 10*3/uL (ref 150–400)
RBC: 4.06 MIL/uL (ref 3.87–5.11)
RDW: 12 % (ref 11.5–15.5)
WBC: 2.8 10*3/uL — ABNORMAL LOW (ref 4.0–10.5)

## 2017-03-09 LAB — HCG, QUANTITATIVE, PREGNANCY: hCG, Beta Chain, Quant, S: 253 m[IU]/mL — ABNORMAL HIGH (ref <5)

## 2017-03-09 NOTE — MAU Provider Note (Signed)
History     CSN: [id number]  Arrival date and time: 03/09/17 [id number]   First Provider Initiated Contact with Patient 03/09/17 0957      Chief Complaint  Patient presents with  . Vaginal Bleeding   HPI Cynthia Mcdowell is a 27 y.o. G2P1001 at 3243w1d by LMP who presents with vaginal bleeding. Reports light vaginal spotting that started on Thursday after having intercourse. Initially was bright red; since yesterday it has been brown spotting. Denies abdominal pain.   OB History    Gravida Para Term Preterm AB Living   2 1 1     1    SAB TAB Ectopic Multiple Live Births           1      Past Medical History:  Diagnosis Date  . Allergy    seasonal, food  . Asthma   . Chlamydia contact, treated    Age 27   . Constipation 08/2008  . H/O candidiasis   . H/O varicella   . Hx of chlamydia infection    2010  . Irregular periods/menstrual cycles 08/10/2011    Past Surgical History:  Procedure Laterality Date  . NO PAST SURGERIES      Family History  Problem Relation Age of Onset  . Breast cancer Maternal Grandmother   . Hypertension Mother   . Heart disease Maternal Uncle   . Anesthesia problems Neg Hx   . Hypotension Neg Hx   . Malignant hyperthermia Neg Hx   . Pseudochol deficiency Neg Hx     Social History   Tobacco Use  . Smoking status: Never Smoker  . Smokeless tobacco: Never Used  Substance Use Topics  . Alcohol use: No  . Drug use: No    Allergies:  Allergies  Allergen Reactions  . 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 Shellfish Allergy Anaphylaxis    Medications Prior to Admission  Medication Sig Dispense Refill Last Dose  . albuterol (PROVENTIL HFA;VENTOLIN HFA) 108 (90 BASE) MCG/ACT inhaler Inhale 2 puffs into the lungs every 4 (four) hours as needed for wheezing.   05/16/2012 at Unknown  . albuterol (PROVENTIL HFA;VENTOLIN HFA) 108 (90 BASE) MCG/ACT inhaler Inhale 2 puffs into the lungs every 4 (four) hours as needed  for wheezing. 6.7 g 0 couple months  . albuterol (PROVENTIL) (2.5 MG/3ML) 0.083% nebulizer solution Take 2.5 mg by nebulization every 6 (six) hours as needed for wheezing.   couple months  . Docusate Calcium (STOOL SOFTENER PO) Take 2 tablets by mouth daily as needed (constipation).   02/03/2014 at Unknown time  . EPINEPHrine (EPIPEN) 0.3 mg/0.3 mL DEVI Inject 0.3 mLs (0.3 mg total) into the muscle once. 1 Device 3 never  . metroNIDAZOLE (FLAGYL) 500 MG tablet Take 1 tablet (500 mg total) by mouth 2 (two) times daily. 14 tablet 0     Review of Systems  Constitutional: Negative.   Gastrointestinal: Negative.  Negative for abdominal pain.  Genitourinary: Positive for vaginal bleeding.   Physical Exam   Blood pressure 107/65, pulse 86, temperature 98.5 F (36.9 C), temperature source Axillary, resp. rate 16, weight 140 lb 8 oz (63.7 kg), last menstrual period 01/25/2017, SpO2 100 %.  Physical Exam  Nursing note and vitals reviewed. Constitutional: She is oriented to person, place, and time. She appears well-developed and well-nourished. No distress.  HENT:  Head: Normocephalic and atraumatic.  Eyes: Conjunctivae are normal. Right eye exhibits no discharge. Left eye exhibits no  discharge. No scleral icterus.  Neck: Normal range of motion.  Respiratory: Effort normal. No respiratory distress.  GI: Soft. There is no tenderness.  Neurological: She is alert and oriented to person, place, and time.  Skin: Skin is warm and dry. She is not diaphoretic.  Psychiatric: She has a normal mood and affect. Her behavior is normal. Judgment and thought content normal.    MAU Course  Procedures Results for orders placed or performed during the hospital encounter of 03/09/17 (from the past 24 hour(s))  Urinalysis, Routine w reflex microscopic     Status: Abnormal   Collection Time: 03/09/17  9:12 AM  Result Value Ref Range   Color, Urine RED (A) YELLOW   APPearance TURBID (A) CLEAR   Specific Gravity,  Urine  1.005 - 1.030    TEST NOT REPORTED DUE TO COLOR INTERFERENCE OF URINE PIGMENT   pH  5.0 - 8.0    TEST NOT REPORTED DUE TO COLOR INTERFERENCE OF URINE PIGMENT   Glucose, UA NEGATIVE NEGATIVE mg/dL   Hgb urine dipstick (A) NEGATIVE    TEST NOT REPORTED DUE TO COLOR INTERFERENCE OF URINE PIGMENT   Bilirubin Urine (A) NEGATIVE    TEST NOT REPORTED DUE TO COLOR INTERFERENCE OF URINE PIGMENT   Ketones, ur (A) NEGATIVE mg/dL    TEST NOT REPORTED DUE TO COLOR INTERFERENCE OF URINE PIGMENT   Protein, ur (A) NEGATIVE mg/dL    TEST NOT REPORTED DUE TO COLOR INTERFERENCE OF URINE PIGMENT   Nitrite (A) NEGATIVE    TEST NOT REPORTED DUE TO COLOR INTERFERENCE OF URINE PIGMENT   Leukocytes, UA (A) NEGATIVE    TEST NOT REPORTED DUE TO COLOR INTERFERENCE OF URINE PIGMENT  Urinalysis, Microscopic (reflex)     Status: Abnormal   Collection Time: 03/09/17  9:12 AM  Result Value Ref Range   RBC / HPF TOO NUMEROUS TO COUNT 0 - 5 RBC/hpf   WBC, UA 0-5 0 - 5 WBC/hpf   Bacteria, UA FEW (A) NONE SEEN   Squamous Epithelial / LPF 0-5 (A) NONE SEEN  CBC     Status: Abnormal   Collection Time: 03/09/17  9:25 AM  Result Value Ref Range   WBC 2.8 (L) 4.0 - 10.5 K/uL   RBC 4.06 3.87 - 5.11 MIL/uL   Hemoglobin 12.9 12.0 - 15.0 g/dL   HCT 16.1 09.6 - 04.5 %   MCV 89.2 78.0 - 100.0 fL   MCH 31.8 26.0 - 34.0 pg   MCHC 35.6 30.0 - 36.0 g/dL   RDW 40.9 81.1 - 91.4 %   Platelets 192 150 - 400 K/uL  hCG, quantitative, pregnancy     Status: Abnormal   Collection Time: 03/09/17  9:25 AM  Result Value Ref Range   hCG, Beta Chain, Quant, S 253 (H) <5 mIU/mL    MDM B positive HCG --- HCG rising appropriately Component     Latest Ref Rng & Units 03/02/2017 03/04/2017 03/09/2017  HCG, Beta Chain, Quant, S     <5 mIU/mL 19 (H) 44 (H) 253 (H)   Pt has outpatient ultrasound scheduled for viability in 2 weeks.   Assessment and Plan  A: 1. Postcoital bleeding   2. Less than [redacted] weeks gestation of pregnancy     P: Discharge home Discussed reasons to return to MAU Pelvic rest Keep scheduled ultrasound  Judeth Horn 03/09/2017, 9:57 AM

## 2017-03-09 NOTE — Discharge Instructions (Signed)

## 2017-03-09 NOTE — MAU Note (Signed)
Pt complains of vaginal bleeding starting last Thursday. Pt states it is like a light period with bright red blood mixed with light brown.

## 2017-03-18 ENCOUNTER — Ambulatory Visit: Payer: Self-pay | Admitting: *Deleted

## 2017-03-18 ENCOUNTER — Ambulatory Visit (HOSPITAL_COMMUNITY)
Admission: RE | Admit: 2017-03-18 | Discharge: 2017-03-18 | Disposition: A | Discharge status: Home / Self Care | Payer: Medicaid Other | Source: Ambulatory Visit | Attending: Family Medicine | Admitting: Family Medicine

## 2017-03-18 DIAGNOSIS — Z3490 Encounter for supervision of normal pregnancy, unspecified, unspecified trimester: Secondary | ICD-10-CM | POA: Insufficient documentation

## 2017-03-18 DIAGNOSIS — O3680X0 Pregnancy with inconclusive fetal viability, not applicable or unspecified: Secondary | ICD-10-CM

## 2017-03-18 NOTE — Progress Notes (Signed)
Here for Cynthia Mcdowell results. Reviewed with Judeth HornErin Lawrence, NP and informed patient Cynthia Mcdowell does not yet show live baby. She states since last mau visit has had light dark brown discharge- wears pad but only changes once a day.  Discussed we recommend drawing a non- stat bhcg today and then will decide plan of care based on results. Sent her a mychart text to sign up. Denies pain today.  Ectopic precautions reviewed.

## 2017-03-18 NOTE — Progress Notes (Signed)
Chart reviewed for nurse visit. Agree with plan of care.   Judeth HornLawrence, Beckhem Isadore, NP 03/18/2017 11:07 AM

## 2017-03-19 ENCOUNTER — Telehealth: Payer: Self-pay | Admitting: *Deleted

## 2017-03-19 LAB — BETA HCG QUANT (REF LAB): hCG Quant: 2782 m[IU]/mL

## 2017-03-19 NOTE — Telephone Encounter (Signed)
I called Marylene Landngela back and was able to get a voicemail and leave a message as above.

## 2017-03-19 NOTE — Telephone Encounter (Signed)
Notified by Cynthia Mcdowell , NP that Cynthia Mcdowell's bhcg had not risen as expected , ordered her to come back for a stat bhcg 03/19/17.  I called Cynthia Landngela and not sure if a voicemail picked up - but attempted to leave message notifying her I have results and need her to come 03/20/17 for stat bhcg and to please call to schedule.

## 2017-03-20 ENCOUNTER — Ambulatory Visit: Payer: Self-pay | Admitting: *Deleted

## 2017-03-20 DIAGNOSIS — O3680X0 Pregnancy with inconclusive fetal viability, not applicable or unspecified: Secondary | ICD-10-CM

## 2017-03-20 LAB — HCG, QUANTITATIVE, PREGNANCY: hCG, Beta Chain, Quant, S: 4272 m[IU]/mL — ABNORMAL HIGH (ref <5)

## 2017-03-20 NOTE — Progress Notes (Signed)
Here for stat bhcg. States is having light  dark brown discharge - same as before. Denies pain. Explained to her we will draw stat bhcg and have her wait for results. She voices understanding.   Reviewed results with Steward DroneVeronica Rogers, CNM and informed Marylene Landngela there was an appropriate rise in her hcg but because us did not show intrauterine pregnancy we still have concern for ectopic pregnancy. Reviewed ectopic precautions and informed her we have scheduled her for a follow up us on 03/30/17 and come to our office for results afterwards. She voices understanding.

## 2017-03-20 NOTE — Progress Notes (Signed)
I have reviewed the chart and agree with nursing staff's documentation of this patient's encounter.  Sharyon CableVeronica C Deloise Marchant, CNM 03/20/2017 5:28 PM

## 2017-03-20 NOTE — Telephone Encounter (Signed)
I called Cynthia Mcdowell and she states can not come until 3 pm today for stat bhcg, I informed her that was fine.

## 2017-03-30 ENCOUNTER — Encounter: Payer: Self-pay | Admitting: Obstetrics & Gynecology

## 2017-03-30 ENCOUNTER — Inpatient Hospital Stay (HOSPITAL_COMMUNITY): Payer: Medicaid Other | Admitting: Anesthesiology

## 2017-03-30 ENCOUNTER — Inpatient Hospital Stay (HOSPITAL_COMMUNITY)
Admission: AD | Admit: 2017-03-30 | Discharge: 2017-03-30 | Disposition: A | Discharge status: Home / Self Care | Payer: Medicaid Other | Source: Ambulatory Visit | Attending: Obstetrics and Gynecology | Admitting: Obstetrics and Gynecology

## 2017-03-30 ENCOUNTER — Encounter: Payer: Self-pay | Admitting: Obstetrics and Gynecology

## 2017-03-30 ENCOUNTER — Encounter (HOSPITAL_COMMUNITY)
Admission: AD | Disposition: A | Discharge status: Home / Self Care | Payer: Self-pay | Source: Ambulatory Visit | Attending: Obstetrics & Gynecology

## 2017-03-30 ENCOUNTER — Other Ambulatory Visit: Payer: Self-pay

## 2017-03-30 ENCOUNTER — Ambulatory Visit (HOSPITAL_COMMUNITY)
Admission: RE | Admit: 2017-03-30 | Discharge: 2017-03-30 | Disposition: A | Discharge status: Home / Self Care | Payer: Medicaid Other | Source: Ambulatory Visit | Attending: Certified Nurse Midwife | Admitting: Certified Nurse Midwife

## 2017-03-30 ENCOUNTER — Encounter (HOSPITAL_COMMUNITY): Payer: Self-pay | Admitting: *Deleted

## 2017-03-30 ENCOUNTER — Ambulatory Visit: Payer: Self-pay

## 2017-03-30 ENCOUNTER — Ambulatory Visit (HOSPITAL_COMMUNITY)
Admission: AD | Admit: 2017-03-30 | Discharge: 2017-03-30 | Disposition: A | Discharge status: Home / Self Care | Payer: Medicaid Other | Source: Ambulatory Visit | Attending: Obstetrics & Gynecology | Admitting: Obstetrics & Gynecology

## 2017-03-30 DIAGNOSIS — D72819 Decreased white blood cell count, unspecified: Secondary | ICD-10-CM | POA: Insufficient documentation

## 2017-03-30 DIAGNOSIS — O00109 Unspecified tubal pregnancy without intrauterine pregnancy: Secondary | ICD-10-CM | POA: Insufficient documentation

## 2017-03-30 DIAGNOSIS — J45909 Unspecified asthma, uncomplicated: Secondary | ICD-10-CM | POA: Diagnosis not present

## 2017-03-30 DIAGNOSIS — Z79899 Other long term (current) drug therapy: Secondary | ICD-10-CM | POA: Diagnosis not present

## 2017-03-30 DIAGNOSIS — O00102 Left tubal pregnancy without intrauterine pregnancy: Secondary | ICD-10-CM | POA: Insufficient documentation

## 2017-03-30 DIAGNOSIS — O3680X0 Pregnancy with inconclusive fetal viability, not applicable or unspecified: Secondary | ICD-10-CM

## 2017-03-30 HISTORY — PX: LAPAROSCOPY: SHX197

## 2017-03-30 LAB — COMPREHENSIVE METABOLIC PANEL
ALT: 12 U/L — ABNORMAL LOW (ref 14–54)
AST: 19 U/L (ref 15–41)
Albumin: 4.2 g/dL (ref 3.5–5.0)
Alkaline Phosphatase: 81 U/L (ref 38–126)
Anion gap: 8 (ref 5–15)
BUN: 10 mg/dL (ref 6–20)
CHLORIDE: 101 mmol/L (ref 101–111)
CO2: 25 mmol/L (ref 22–32)
Calcium: 9.4 mg/dL (ref 8.9–10.3)
Creatinine, Ser: 0.63 mg/dL (ref 0.44–1.00)
Glucose, Bld: 86 mg/dL (ref 65–99)
POTASSIUM: 3.7 mmol/L (ref 3.5–5.1)
Sodium: 134 mmol/L — ABNORMAL LOW (ref 135–145)
Total Bilirubin: 0.6 mg/dL (ref 0.3–1.2)
Total Protein: 8.1 g/dL (ref 6.5–8.1)

## 2017-03-30 LAB — CBC
HCT: 37 % (ref 36.0–46.0)
Hemoglobin: 13 g/dL (ref 12.0–15.0)
MCH: 31.3 pg (ref 26.0–34.0)
MCHC: 35.1 g/dL (ref 30.0–36.0)
MCV: 88.9 fL (ref 78.0–100.0)
PLATELETS: 169 10*3/uL (ref 150–400)
RBC: 4.16 MIL/uL (ref 3.87–5.11)
RDW: 12 % (ref 11.5–15.5)
WBC: 2.6 10*3/uL — AB (ref 4.0–10.5)

## 2017-03-30 LAB — TYPE AND SCREEN
ABO/RH(D): B POS
Antibody Screen: NEGATIVE

## 2017-03-30 LAB — HCG, QUANTITATIVE, PREGNANCY: HCG, BETA CHAIN, QUANT, S: 5735 m[IU]/mL — AB (ref <5)

## 2017-03-30 SURGERY — LAPAROSCOPY OPERATIVE
Anesthesia: General | Site: Abdomen

## 2017-03-30 MED ORDER — HYDROMORPHONE HCL 1 MG/ML IJ SOLN
INTRAMUSCULAR | Status: AC
  Filled 2017-03-30: qty 1

## 2017-03-30 MED ORDER — MIDAZOLAM HCL 2 MG/2ML IJ SOLN
INTRAMUSCULAR | Status: AC
  Filled 2017-03-30: qty 2

## 2017-03-30 MED ORDER — DEXAMETHASONE SODIUM PHOSPHATE 10 MG/ML IJ SOLN
INTRAMUSCULAR | Status: DC | PRN
  Administered 2017-03-30: 10 mg via INTRAVENOUS

## 2017-03-30 MED ORDER — MIDAZOLAM HCL 2 MG/2ML IJ SOLN
0.5000 mg | Freq: Once | INTRAMUSCULAR | Status: AC | PRN
  Administered 2017-03-30: 2 mg via INTRAVENOUS

## 2017-03-30 MED ORDER — ONDANSETRON HCL 4 MG/2ML IJ SOLN
INTRAMUSCULAR | Status: AC
  Filled 2017-03-30: qty 2

## 2017-03-30 MED ORDER — DOCUSATE SODIUM 100 MG PO CAPS: 100.0000 mg | ORAL_CAPSULE | Freq: Two times a day (BID) | ORAL | 2 refills | Status: DC | PRN

## 2017-03-30 MED ORDER — HYDROMORPHONE HCL 1 MG/ML IJ SOLN: 0.2500 mg | INTRAMUSCULAR | Status: DC | PRN

## 2017-03-30 MED ORDER — SODIUM CHLORIDE 0.9 % IR SOLN
Status: DC | PRN
  Administered 2017-03-30: 3000 mL

## 2017-03-30 MED ORDER — MEPERIDINE HCL 25 MG/ML IJ SOLN: 6.2500 mg | INTRAMUSCULAR | Status: DC | PRN

## 2017-03-30 MED ORDER — SUGAMMADEX SODIUM 200 MG/2ML IV SOLN
INTRAVENOUS | Status: DC | PRN
  Administered 2017-03-30: 200 mg via INTRAVENOUS

## 2017-03-30 MED ORDER — ROCURONIUM BROMIDE 100 MG/10ML IV SOLN
INTRAVENOUS | Status: AC
  Filled 2017-03-30: qty 1

## 2017-03-30 MED ORDER — SCOPOLAMINE 1 MG/3DAYS TD PT72
MEDICATED_PATCH | TRANSDERMAL | Status: DC | PRN
  Administered 2017-03-30: 1 via TRANSDERMAL

## 2017-03-30 MED ORDER — DEXAMETHASONE SODIUM PHOSPHATE 4 MG/ML IJ SOLN
INTRAMUSCULAR | Status: AC
  Filled 2017-03-30: qty 1

## 2017-03-30 MED ORDER — CEFAZOLIN SODIUM-DEXTROSE 2-4 GM/100ML-% IV SOLN
2.0000 g | INTRAVENOUS | Status: AC
  Administered 2017-03-30: 2 g via INTRAVENOUS
  Filled 2017-03-30: qty 100

## 2017-03-30 MED ORDER — BUPIVACAINE HCL (PF) 0.5 % IJ SOLN
INTRAMUSCULAR | Status: DC | PRN
  Administered 2017-03-30: 8 mL

## 2017-03-30 MED ORDER — FENTANYL CITRATE (PF) 250 MCG/5ML IJ SOLN
INTRAMUSCULAR | Status: AC
  Filled 2017-03-30: qty 5

## 2017-03-30 MED ORDER — FENTANYL CITRATE (PF) 100 MCG/2ML IJ SOLN
INTRAMUSCULAR | Status: DC | PRN
  Administered 2017-03-30 (×2): 100 ug via INTRAVENOUS
  Administered 2017-03-30: 50 ug via INTRAVENOUS

## 2017-03-30 MED ORDER — ONDANSETRON HCL 4 MG/2ML IJ SOLN
INTRAMUSCULAR | Status: DC | PRN
  Administered 2017-03-30: 4 mg via INTRAVENOUS

## 2017-03-30 MED ORDER — FAMOTIDINE IN NACL 20-0.9 MG/50ML-% IV SOLN
20.0000 mg | Freq: Once | INTRAVENOUS | Status: AC
  Administered 2017-03-30: 20 mg via INTRAVENOUS
  Filled 2017-03-30: qty 50

## 2017-03-30 MED ORDER — KETOROLAC TROMETHAMINE 30 MG/ML IJ SOLN
INTRAMUSCULAR | Status: DC | PRN
  Administered 2017-03-30: 30 mg via INTRAVENOUS

## 2017-03-30 MED ORDER — OXYCODONE-ACETAMINOPHEN 5-325 MG PO TABS: 1.0000 | ORAL_TABLET | ORAL | 0 refills | Status: DC | PRN

## 2017-03-30 MED ORDER — PROMETHAZINE HCL 25 MG/ML IJ SOLN: 6.2500 mg | INTRAMUSCULAR | Status: DC | PRN

## 2017-03-30 MED ORDER — SUCCINYLCHOLINE CHLORIDE 200 MG/10ML IV SOSY
PREFILLED_SYRINGE | INTRAVENOUS | Status: AC
Start: 2017-03-30 — End: ?
  Filled 2017-03-30: qty 10

## 2017-03-30 MED ORDER — SCOPOLAMINE 1 MG/3DAYS TD PT72
MEDICATED_PATCH | TRANSDERMAL | Status: AC
  Filled 2017-03-30: qty 1

## 2017-03-30 MED ORDER — LACTATED RINGERS IV BOLUS (SEPSIS)
1000.0000 mL | Freq: Once | INTRAVENOUS | Status: AC
  Administered 2017-03-30: 1000 mL via INTRAVENOUS

## 2017-03-30 MED ORDER — LIDOCAINE HCL (CARDIAC) 20 MG/ML IV SOLN
INTRAVENOUS | Status: AC
  Filled 2017-03-30: qty 5

## 2017-03-30 MED ORDER — KETOROLAC TROMETHAMINE 30 MG/ML IJ SOLN
INTRAMUSCULAR | Status: AC
  Filled 2017-03-30: qty 1

## 2017-03-30 MED ORDER — PROPOFOL 10 MG/ML IV BOLUS
INTRAVENOUS | Status: AC
  Filled 2017-03-30: qty 20

## 2017-03-30 MED ORDER — HYDROMORPHONE HCL 1 MG/ML IJ SOLN
INTRAMUSCULAR | Status: DC | PRN
  Administered 2017-03-30: 1 mg via INTRAVENOUS

## 2017-03-30 MED ORDER — PROPOFOL 10 MG/ML IV BOLUS
INTRAVENOUS | Status: DC | PRN
  Administered 2017-03-30: 150 mg via INTRAVENOUS

## 2017-03-30 MED ORDER — SUCCINYLCHOLINE CHLORIDE 20 MG/ML IJ SOLN
INTRAMUSCULAR | Status: DC | PRN
  Administered 2017-03-30: 100 mg via INTRAVENOUS

## 2017-03-30 MED ORDER — SOD CITRATE-CITRIC ACID 500-334 MG/5ML PO SOLN
30.0000 mL | Freq: Once | ORAL | Status: AC
  Administered 2017-03-30: 30 mL via ORAL
  Filled 2017-03-30: qty 15

## 2017-03-30 MED ORDER — LIDOCAINE HCL (CARDIAC) 20 MG/ML IV SOLN
INTRAVENOUS | Status: DC | PRN
  Administered 2017-03-30: 20 mg via INTRAVENOUS

## 2017-03-30 MED ORDER — ROCURONIUM BROMIDE 100 MG/10ML IV SOLN
INTRAVENOUS | Status: DC | PRN
  Administered 2017-03-30: 30 mg via INTRAVENOUS

## 2017-03-30 MED ORDER — IBUPROFEN 800 MG PO TABS: 800.0000 mg | ORAL_TABLET | Freq: Three times a day (TID) | ORAL | 3 refills | Status: DC | PRN

## 2017-03-30 MED ORDER — LACTATED RINGERS IV SOLN
INTRAVENOUS | Status: DC
  Administered 2017-03-30 (×2): via INTRAVENOUS

## 2017-03-30 SURGICAL SUPPLY — 28 items
CABLE HIGH FREQUENCY MONO STRZ (ELECTRODE) IMPLANT
DERMABOND ADVANCED (GAUZE/BANDAGES/DRESSINGS) ×2
DERMABOND ADVANCED .7 DNX12 (GAUZE/BANDAGES/DRESSINGS) ×1 IMPLANT
DRSG OPSITE POSTOP 3X4 (GAUZE/BANDAGES/DRESSINGS) ×3 IMPLANT
DURAPREP 26ML APPLICATOR (WOUND CARE) ×3 IMPLANT
GLOVE BIOGEL PI IND STRL 7.0 (GLOVE) ×4 IMPLANT
GLOVE BIOGEL PI INDICATOR 7.0 (GLOVE) ×8
GLOVE ECLIPSE 7.0 STRL STRAW (GLOVE) ×3 IMPLANT
GOWN STRL REUS W/TWL LRG LVL3 (GOWN DISPOSABLE) ×9 IMPLANT
NEEDLE INSUFFLATION 120MM (ENDOMECHANICALS) IMPLANT
NS IRRIG 1000ML POUR BTL (IV SOLUTION) ×3 IMPLANT
PACK LAPAROSCOPY BASIN (CUSTOM PROCEDURE TRAY) ×3 IMPLANT
PACK TRENDGUARD 450 HYBRID PRO (MISCELLANEOUS) ×1 IMPLANT
PACK TRENDGUARD 600 HYBRD PROC (MISCELLANEOUS) IMPLANT
POUCH SPECIMEN RETRIEVAL 10MM (ENDOMECHANICALS) ×6 IMPLANT
PROTECTOR NERVE ULNAR (MISCELLANEOUS) ×6 IMPLANT
SET IRRIG TUBING LAPAROSCOPIC (IRRIGATION / IRRIGATOR) ×3 IMPLANT
SHEARS HARMONIC ACE PLUS 36CM (ENDOMECHANICALS) ×3 IMPLANT
SLEEVE XCEL OPT CAN 5 100 (ENDOMECHANICALS) ×3 IMPLANT
SUT VICRYL 0 UR6 27IN ABS (SUTURE) ×3 IMPLANT
SUT VICRYL 4-0 PS2 18IN ABS (SUTURE) ×3 IMPLANT
TOWEL OR 17X24 6PK STRL BLUE (TOWEL DISPOSABLE) ×6 IMPLANT
TRAY FOLEY CATH SILVER 14FR (SET/KITS/TRAYS/PACK) ×3 IMPLANT
TRENDGUARD 450 HYBRID PRO PACK (MISCELLANEOUS) ×3
TRENDGUARD 600 HYBRID PROC PK (MISCELLANEOUS)
TROCAR XCEL NON-BLD 11X100MML (ENDOMECHANICALS) ×3 IMPLANT
TROCAR XCEL NON-BLD 5MMX100MML (ENDOMECHANICALS) ×3 IMPLANT
WARMER LAPAROSCOPE (MISCELLANEOUS) ×3 IMPLANT

## 2017-03-30 NOTE — Anesthesia Postprocedure Evaluation (Signed)
Anesthesia Post Note  Patient: Cynthia Mcdowell  Procedure(s) Performed: LAPAROSCOPY OPERATIVE WITH LEFT SALPINGECTOMY (N/A Abdomen)     Patient location during evaluation: PACU Anesthesia Type: General Level of consciousness: awake and alert, patient cooperative and oriented Pain management: pain level controlled Vital Signs Assessment: post-procedure vital signs reviewed and stable Respiratory status: spontaneous breathing, nonlabored ventilation and respiratory function stable Cardiovascular status: blood pressure returned to baseline and stable Postop Assessment: no apparent nausea or vomiting Anesthetic complications: no    Last Vitals:  Vitals:   03/30/17 2200 03/30/17 2215  BP: 106/67 110/69  Pulse: 85 79  Resp: 18 16  Temp:    SpO2: 93% 100%    Last Pain:  Vitals:   03/30/17 2215  TempSrc:   PainSc: 0-No pain   Pain Goal:                 Raihan Kimmel,E. Diyana Starrett

## 2017-03-30 NOTE — MAU Provider Note (Addendum)
Cynthia Mcdowell  is a 27 y.o. G2P1001 at 1636w1d who presents to MAU today for US results. She was being followed for pregnancy of unknown location. The last quant HCG on 03/20/17 was 4272  and US on 03/18/17 showed no IUP or adnexal mass. She denies pain, vaginal bleeding or fever today.   OB History  Gravida Para Term Preterm AB Living  2 1 1     1   SAB TAB Ectopic Multiple Live Births          1    # Outcome Date GA Lbr Len/2nd Weight Sex Delivery Anes PTL Lv  2 Current           1 Term 06/24/11 4336w0d 03:36 / 00:02 7 lb 1.2 oz (3.209 kg) F Vag-Spont Local  LIV      Past Medical History:  Diagnosis Date  . Allergy    seasonal, food  . Asthma   . Chlamydia contact, treated    Age 27   . Constipation 08/2008  . H/O candidiasis   . H/O varicella   . Hx of chlamydia infection    2010  . Irregular periods/menstrual cycles 08/10/2011    ROS: no VB no pain  BP 120/69 (BP Location: Right Arm)   Pulse 86   Temp 98 F (36.7 C) (Oral)   Resp 16   Ht 5\' 3"  (1.6 m)   Wt 141 lb 12 oz (64.3 kg)   LMP 01/25/2017   SpO2 100%   BMI 25.11 kg/m   CONSTITUTIONAL: Well-developed, well-nourished female in no acute distress.  MUSCULOSKELETAL: Normal range of motion.  CARDIOVASCULAR: Regular heart rate RESPIRATORY: Normal effort NEUROLOGICAL: Alert and oriented to person, place, and time.  SKIN: Not diaphoretic. No erythema. No pallor. PSYCH: Normal mood and affect. Normal behavior. Normal judgment and thought content.  Results for orders placed or performed during the hospital encounter of 03/30/17 (from the past 24 hour(s))  CBC     Status: Abnormal   Collection Time: 03/30/17  9:29 AM  Result Value Ref Range   WBC 2.6 (L) 4.0 - 10.5 K/uL   RBC 4.16 3.87 - 5.11 MIL/uL   Hemoglobin 13.0 12.0 - 15.0 g/dL   HCT 78.437.0 69.636.0 - 29.546.0 %   MCV 88.9 78.0 - 100.0 fL   MCH 31.3 26.0 - 34.0 pg   MCHC 35.1 30.0 - 36.0 g/dL   RDW 28.412.0 13.211.5 - 44.015.5 %   Platelets 169 150 - 400 K/uL   Comprehensive metabolic panel     Status: Abnormal   Collection Time: 03/30/17  9:29 AM  Result Value Ref Range   Sodium 134 (L) 135 - 145 mmol/L   Potassium 3.7 3.5 - 5.1 mmol/L   Chloride 101 101 - 111 mmol/L   CO2 25 22 - 32 mmol/L   Glucose, Bld 86 65 - 99 mg/dL   BUN 10 6 - 20 mg/dL   Creatinine, Ser 1.020.63 0.44 - 1.00 mg/dL   Calcium 9.4 8.9 - 72.510.3 mg/dL   Total Protein 8.1 6.5 - 8.1 g/dL   Albumin 4.2 3.5 - 5.0 g/dL   AST 19 15 - 41 U/L   ALT 12 (L) 14 - 54 U/L   Alkaline Phosphatase 81 38 - 126 U/L   Total Bilirubin 0.6 0.3 - 1.2 mg/dL   GFR calc non Af Amer >60 >60 mL/min   GFR calc Af Amer >60 >60 mL/min   Anion gap 8 5 - 15  hCG,  quantitative, pregnancy     Status: Abnormal   Collection Time: 03/30/17  9:29 AM  Result Value Ref Range   hCG, Beta Chain, Quant, S 5,735 (H) <5 mIU/mL  Type and screen [facility] [place]     Status: None   Collection Time: 03/30/17 10:22 AM  Result Value Ref Range   ABO/RH(D) B POS    Antibody Screen NEG    Sample Expiration      04/02/2017 Performed at [facility], [address] [place], [place] [address]    US Ob Transvaginal  Result Date: 03/30/2017 CLINICAL DATA:  1st trimester pregnancy of unknown anatomic location. Gestational age by LMP of 9 weeks 1 day. EXAM: TRANSVAGINAL OB ULTRASOUND TECHNIQUE: Transvaginal ultrasound was performed for complete evaluation of the gestation as well as the maternal uterus, adnexal regions, and pelvic cul-de-sac. COMPARISON:  None. FINDINGS: Intrauterine gestational sac: None Maternal uterus/adnexae: No fibroids or other uterine abnormality identified. Both ovaries are normal appearance. In the left adnexa, there is an echogenic mass with central cystic area which abuts the left ovary. This measures 3.5 x 2.2 x 1.9 cm, and is highly suspicious for ectopic pregnancy. Small amount of simple free fluid noted in pelvic cul-de-sac. IMPRESSION: No IUP visualized. 3.5 cm left adnexal mass,  highly suspicious for ectopic pregnancy. Small amount of simple free fluid. These results were called by telephone at the time of interpretation on 03/30/2017 at 9:30 am to the patient's caretaker in MAU, who verbally acknowledged these results. Electronically Signed   By: Myles Rosenthal M.D.   On: 03/30/2017 09:44   MDM: Consult with Dr. Vergie Living. MD in to discuss plan with pt. Plan for OR but pt ate recently. Pt cannot stay and wait d/t childcare issues, she will return for surgery today at 1700.  A: Left ectopic pregnancy  P: Discharge home Patient should return to MAU if her condition were to change, she will return today at 1700 for OR  Donette Larry, [place] 03/30/2017 12:32 PM

## 2017-03-30 NOTE — Anesthesia Preprocedure Evaluation (Addendum)
Anesthesia Evaluation  Patient identified by MRN, date of birth, ID band Patient awake    Reviewed: Allergy & Precautions, NPO status , Patient's Chart, lab work & pertinent test results  History of Anesthesia Complications Negative for: history of anesthetic complications  Airway Mallampati: II  TM Distance: >3 FB Neck ROM: Full    Dental  (+) Dental Advisory Given   Pulmonary asthma (hasn't needed inhaler in years) ,    breath sounds clear to auscultation       Cardiovascular negative cardio ROS   Rhythm:Regular Rate:Normal     Neuro/Psych negative neurological ROS  negative psych ROS   GI/Hepatic negative GI ROS, Neg liver ROS,   Endo/Other    Renal/GU negative Renal ROS     Musculoskeletal   Abdominal   Peds  Hematology Hb 13, plt 169k   Anesthesia Other Findings   Reproductive/Obstetrics (+) Pregnancy (ectopic)                            Anesthesia Physical Anesthesia Plan  ASA: II and emergent  Anesthesia Plan: General   Post-op Pain Management:    Induction: Intravenous and Rapid sequence  PONV Risk Score and Plan: 4 or greater and Scopolamine patch - Pre-op, Dexamethasone and Ondansetron  Airway Management Planned: Oral ETT  Additional Equipment:   Intra-op Plan:   Post-operative Plan: Extubation in OR  Informed Consent: I have reviewed the patients History and Physical, chart, labs and discussed the procedure including the risks, benefits and alternatives for the proposed anesthesia with the patient or authorized representative who has indicated his/her understanding and acceptance.   Dental advisory given  Plan Discussed with: Surgeon and CRNA  Anesthesia Plan Comments: (Plan routine monitors, GETA)       Anesthesia Quick Evaluation

## 2017-03-30 NOTE — Anesthesia Procedure Notes (Signed)
Procedure Name: Intubation Date/Time: 03/30/2017 7:19 PM Performed by: Renford DillsMullins, Desmond Tufano L, CRNA Pre-anesthesia Checklist: Patient identified, Emergency Drugs available, Suction available and Patient being monitored Patient Re-evaluated:Patient Re-evaluated prior to induction Oxygen Delivery Method: Circle system utilized Preoxygenation: Pre-oxygenation with 100% oxygen Induction Type: IV induction, Rapid sequence and Cricoid Pressure applied Laryngoscope Size: Miller and 2 Grade View: Grade I Tube type: Oral Tube size: 7.0 mm Number of attempts: 1 Airway Equipment and Method: Stylet Placement Confirmation: ETT inserted through vocal cords under direct vision,  positive ETCO2 and breath sounds checked- equal and bilateral Secured at: 20 cm Tube secured with: Tape Dental Injury: Teeth and Oropharynx as per pre-operative assessment

## 2017-03-30 NOTE — Op Note (Signed)
Donnajean LopesAngela B Buel PROCEDURE DATE: 03/30/2017  PREOPERATIVE DIAGNOSIS: Left tubal ectopic pregnancy POSTOPERATIVE DIAGNOSIS: Ruptured left fallopian tube ectopic pregnancy PROCEDURE: Laparoscopic left salpingectomy and removal of ectopic pregnancy SURGEON:  Dr. Jaynie CollinsUgonna Koehn Salehi ANESTHESIOLOGY TEAM: Anesthesiologist: Jairo BenJackson, [place], MD  INDICATIONS: 27 y.o. G2P1001 at 3768w1d here with the preoperative diagnoses as listed above.  Please refer to preoperative notes for more details. Patient was counseled regarding need for laparoscopic salpingectomy. Risks of surgery including bleeding which may require transfusion or reoperation, infection, injury to bowel or other surrounding organs, need for additional procedures including laparotomy and other postoperative/anesthesia complications were explained to patient.  Written informed consent was obtained.  FINDINGS:  Small amount of hemoperitoneum estimated to be about 100 ml of blood and clots.  Dilated left fallopian tube containing ectopic gestation. Small normal appearing uterus, normal right fallopian tube, right ovary and left ovary.  ANESTHESIA: General INTRAVENOUS FLUIDS: 800 ml ESTIMATED BLOOD LOSS: 100 ml URINE OUTPUT: 100 ml SPECIMENS: Left fallopian tube containing ectopic gestation COMPLICATIONS: None immediate  PROCEDURE IN DETAIL:  The patient was taken to the operating room where general anesthesia was administered and was found to be adequate.  She was placed in the dorsal lithotomy position, and was prepped and draped in a sterile manner.  A Foley catheter was inserted into her bladder and attached to constant drainage and a uterine manipulator was then advanced into the uterus .    After an adequate timeout was performed, attention was turned to the abdomen where an umbilical incision was made with the scalpel.  The Optiview 11-mm trocar and sleeve were then advanced without difficulty with the laparoscope under direct visualization into  the abdomen.  The abdomen was then insufflated with carbon dioxide gas and adequate pneumoperitoneum was obtained.  A survey of the patient's pelvis and abdomen revealed the findings above.  Bilateral 5-mm lower quadrant ports were then placed under direct visualization.  The Nezhat suction irrigator was then used to suction the hemoperitoneum and irrigate the pelvis.  Attention was then turned to the left fallopian tube which was grasped and ligated from the underlying mesosalpinx and uterine attachment using the Harmonic instrument.  Good hemostasis was noted.  The specimen was placed in an EndoCatch bag and removed from the abdomen intact.  The abdomen was desufflated, and all instruments were removed.  The fascial incision of the 11-mm site was reapproximated with a 0 Vicryl figure-of-eight stitch; and all skin incisions were closed with 4-0 Monocryl and Dermabond. The patient tolerated the procedure well.  All instruments, needles, and sponge counts were correct x 3. The patient was taken to the recovery room in stable condition.  The patient will be discharged to home as per PACU criteria.  Routine postoperative instructions given.  She was prescribed Percocet, Ibuprofen and Colace.  She will follow up in the clinic in about 2-3 weeks for postoperative evaluation.   Jaynie CollinsUGONNA  Mersadez Linden, MD, FACOG Obstetrician & Gynecologist, [facility], [facility]

## 2017-03-30 NOTE — Transfer of Care (Signed)
Immediate Anesthesia Transfer of Care Note  Patient: Cynthia Mcdowell  Procedure(s) Performed: LAPAROSCOPY OPERATIVE WITH LEFT SALPINGECTOMY (N/A Abdomen)  Patient Location: PACU  Anesthesia Type:General  Level of Consciousness: sedated  Airway & Oxygen Therapy: Patient Spontanous Breathing and Patient connected to nasal cannula oxygen  Post-op Assessment: Report given to RN and Post -op Vital signs reviewed and stable  Post vital signs: stable  Last Vitals:  Vitals:   03/30/17 1733  BP: 118/65  Pulse: 75  Resp: 16  Temp: 37.1 C  SpO2: 100%    Last Pain:  Vitals:   03/30/17 1733  TempSrc: Oral         Complications: No apparent anesthesia complications

## 2017-03-30 NOTE — H&P (Signed)
Preoperative History and Physical  Cynthia Mcdowell is a 27 y.o. G2P1001 here for surgical management of tubal ectopic pregnancy.   No significant preoperative concerns.  Proposed surgery: Laparoscopic salpingectomy and removal of ectopic pregnancy.  Past Medical History:  Diagnosis Date  . Allergy    seasonal, food  . Asthma   . Chlamydia contact, treated    Age 27   . Constipation 08/2008  . H/O candidiasis   . H/O varicella   . Hx of chlamydia infection    2010  . Irregular periods/menstrual cycles 08/10/2011   Past Surgical History:  Procedure Laterality Date  . NO PAST SURGERIES     OB History  Gravida Para Term Preterm AB Living  2 1 1     1   SAB TAB Ectopic Multiple Live Births          1    # Outcome Date GA Lbr Len/2nd Weight Sex Delivery Anes PTL Lv  2 Current           1 Term 06/24/11 [id number] 03:36 / 00:02 7 lb 1.2 oz (3.209 kg) F Vag-Spont Local  LIV    Patient denies any other pertinent gynecologic issues.   No current facility-administered medications on file prior to encounter.    Current Outpatient Medications on File Prior to Encounter  Medication Sig Dispense Refill  . albuterol (PROVENTIL HFA;VENTOLIN HFA) 108 (90 BASE) MCG/ACT inhaler Inhale 2 puffs into the lungs every 4 (four) hours as needed for wheezing.    Marland Kitchen. albuterol (PROVENTIL HFA;VENTOLIN HFA) 108 (90 BASE) MCG/ACT inhaler Inhale 2 puffs into the lungs every 4 (four) hours as needed for wheezing. 6.7 g 0  . albuterol (PROVENTIL) (2.5 MG/3ML) 0.083% nebulizer solution Take 2.5 mg by nebulization every 6 (six) hours as needed for wheezing.    Tery Sanfilippo. Docusate Calcium (STOOL SOFTENER PO) Take 2 tablets by mouth daily as needed (constipation).    Marland Kitchen. EPINEPHrine (EPIPEN) 0.3 mg/0.3 mL DEVI Inject 0.3 mLs (0.3 mg total) into the muscle once. 1 Device 3  . metroNIDAZOLE (FLAGYL) 500 MG tablet Take 1 tablet (500 mg total) by mouth 2 (two) times daily. 14 tablet 0   Allergies  Allergen Reactions  .  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 Shellfish Allergy Anaphylaxis    Social History:   reports that  has never smoked. she has never used smokeless tobacco. She reports that she does not drink alcohol or use drugs.  Family History  Problem Relation Age of Onset  . Breast cancer Maternal Grandmother   . Cancer Maternal Grandmother   . Hypertension Mother   . Hyperlipidemia Father   . Heart disease Maternal Uncle   . Kidney disease Paternal Grandmother   . Anesthesia problems Neg Hx   . Hypotension Neg Hx   . Malignant hyperthermia Neg Hx   . Pseudochol deficiency Neg Hx     Review of Systems: Pertinent items noted in HPI and remainder of comprehensive ROS otherwise negative.  PHYSICAL EXAM: Blood pressure 118/65, pulse 75, temperature 98.7 F (37.1 C), temperature source Oral, resp. rate 16, height 5\' 3"  (1.6 m), weight 141 lb 12 oz (64.3 kg), last menstrual period 01/25/2017, SpO2 100 %. CONSTITUTIONAL: Well-developed, well-nourished female in no acute distress.  HENT:  Normocephalic, atraumatic, External right and left ear normal. Oropharynx is clear and moist EYES: Conjunctivae and EOM are normal. Pupils are equal, round, and reactive to light. No scleral icterus.  NECK:  Normal range of motion, supple, no masses SKIN: Skin is warm and dry. No rash noted. Not diaphoretic. No erythema. No pallor. NEUROLOGIC: Alert and oriented to person, place, and time. Normal reflexes, muscle tone coordination. No cranial nerve deficit noted. PSYCHIATRIC: Normal mood and affect. Normal behavior. Normal judgment and thought content. CARDIOVASCULAR: Normal heart rate noted, regular rhythm RESPIRATORY: Effort and breath sounds normal, no problems with respiration noted ABDOMEN: Soft,  mildly tender, nondistended. PELVIC: Deferred MUSCULOSKELETAL: Normal range of motion. No edema and no tenderness. 2+ distal pulses.  Labs: Results for orders placed or performed  during the hospital encounter of 03/30/17 (from the past 336 hour(s))  CBC   Collection Time: 03/30/17  9:29 AM  Result Value Ref Range   WBC 2.6 (L) 4.0 - 10.5 K/uL   RBC 4.16 3.87 - 5.11 MIL/uL   Hemoglobin 13.0 12.0 - 15.0 g/dL   HCT 13.0 86.5 - 78.4 %   MCV 88.9 78.0 - 100.0 fL   MCH 31.3 26.0 - 34.0 pg   MCHC 35.1 30.0 - 36.0 g/dL   RDW 69.6 29.5 - 28.4 %   Platelets 169 150 - 400 K/uL  Comprehensive metabolic panel   Collection Time: 03/30/17  9:29 AM  Result Value Ref Range   Sodium 134 (L) 135 - 145 mmol/L   Potassium 3.7 3.5 - 5.1 mmol/L   Chloride 101 101 - 111 mmol/L   CO2 25 22 - 32 mmol/L   Glucose, Bld 86 65 - 99 mg/dL   BUN 10 6 - 20 mg/dL   Creatinine, Ser 1.32 0.44 - 1.00 mg/dL   Calcium 9.4 8.9 - 44.0 mg/dL   Total Protein 8.1 6.5 - 8.1 g/dL   Albumin 4.2 3.5 - 5.0 g/dL   AST 19 15 - 41 U/L   ALT 12 (L) 14 - 54 U/L   Alkaline Phosphatase 81 38 - 126 U/L   Total Bilirubin 0.6 0.3 - 1.2 mg/dL   GFR calc non Af Amer >60 >60 mL/min   GFR calc Af Amer >60 >60 mL/min   Anion gap 8 5 - 15  hCG, quantitative, pregnancy   Collection Time: 03/30/17  9:29 AM  Result Value Ref Range   hCG, Beta Chain, Quant, S 5,735 (H) <5 mIU/mL  Type and screen [facility]   Collection Time: 03/30/17 10:22 AM  Result Value Ref Range   ABO/RH(D) B POS    Antibody Screen NEG    Sample Expiration      04/02/2017 Performed at [facility], [address], [place], [place] [address]   Results for orders placed or performed in visit on 03/20/17 (from the past 336 hour(s))  hCG, quantitative, pregnancy   Collection Time: 03/20/17  3:39 PM  Result Value Ref Range   hCG, Beta Chain, Quant, S 4,272 (H) <5 mIU/mL  Results for orders placed or performed in visit on 03/18/17 (from the past 336 hour(s))  Beta HCG, Quant   Collection Time: 03/18/17 11:08 AM  Result Value Ref Range   hCG Quant 2,782 mIU/mL    Imaging Studies: US Ob Transvaginal  Result Date:  03/30/2017 CLINICAL DATA:  1st trimester pregnancy of unknown anatomic location. Gestational age by LMP of 9 weeks 1 day. EXAM: TRANSVAGINAL OB ULTRASOUND TECHNIQUE: Transvaginal ultrasound was performed for complete evaluation of the gestation as well as the maternal uterus, adnexal regions, and pelvic cul-de-sac. COMPARISON:  None. FINDINGS: Intrauterine gestational sac: None Maternal uterus/adnexae: No fibroids or other uterine  abnormality identified. Both ovaries are normal appearance. In the left adnexa, there is an echogenic mass with central cystic area which abuts the left ovary. This measures 3.5 x 2.2 x 1.9 cm, and is highly suspicious for ectopic pregnancy. Small amount of simple free fluid noted in pelvic cul-de-sac. IMPRESSION: No IUP visualized. 3.5 cm left adnexal mass, highly suspicious for ectopic pregnancy. Small amount of simple free fluid. These results were called by telephone at the time of interpretation on 03/30/2017 at 9:30 am to the patient's caretaker in MAU, who verbally acknowledged these results. Electronically Signed   By: Myles Rosenthal M.D.   On: 03/30/2017 09:44   US Ob Transvaginal  Result Date: 03/18/2017 CLINICAL DATA:  Pregnancy with inconclusive viability EXAM: TRANSVAGINAL OB ULTRASOUND TECHNIQUE: Transvaginal ultrasound was performed for complete evaluation of the gestation as well as the maternal uterus, adnexal regions, and pelvic cul-de-sac. COMPARISON:  03/02/2017 FINDINGS: Intrauterine gestational sac: None Yolk sac:  Not visualized Embryo:  Not visualized Cardiac Activity: Heart Rate:  bpm MSD:   mm    w     d CRL:     mm    w  d                  [place] EDC: Subchorionic hemorrhage:  N/A Maternal uterus/adnexae: No adnexal mass. Trace free fluid in the pelvis. IMPRESSION: No intrauterine pregnancy visualized. Differential considerations would include early intrauterine pregnancy too early to visualize, spontaneous abortion, or occult ectopic pregnancy. Recommend close  clinical followup and serial quantitative beta HCGs and ultrasounds. Electronically Signed   By: Charlett Nose M.D.   On: 03/18/2017 10:19   US Ob Less Than 14 Weeks With Ob Transvaginal  Result Date: 03/02/2017 CLINICAL DATA:  Pelvic pain, quantitative HCG pending EXAM: OBSTETRIC <14 WK [place] AND TRANSVAGINAL OB US TECHNIQUE: Both transabdominal and transvaginal ultrasound examinations were performed for complete evaluation of the gestation as well as the maternal uterus, adnexal regions, and pelvic cul-de-sac. Transvaginal technique was performed to assess early pregnancy. COMPARISON:  None. FINDINGS: Intrauterine gestational sac: Not visible Yolk sac:  Not visible Embryo:  Not visible Subchorionic hemorrhage:  None visualized. Maternal uterus/adnexae: Bilateral ovaries are within normal limits. Right ovary measures 3.8 x 2.3 x 2.3 cm. Left ovary measures 3.6 x 2.3 x 2.2 cm. Trace amount of free fluid in the pelvis. Possible luteal cyst in the left ovary. IMPRESSION: No intrauterine pregnancy is visualized. Findings would be consistent with pregnancy of unknown location, differential of which includes IUP too early to visualize, occult ectopic, and recent failed pregnancy. Recommend trending of HCG with repeat ultrasound as indicated. Trace amount of free fluid in the pelvis Electronically Signed   By: Jasmine Pang M.D.   On: 03/02/2017 23:18    Assessment: Patient Active Problem List   Diagnosis Date Noted  . Leukopenia 03/30/2017  . Tubal ectopic pregnancy 03/30/2017  . Irregular periods/menstrual cycles   . Hx of chlamydia infection   . Asthma 09/19/2010  . Environmental allergies 09/19/2010  . Back pain 09/19/2010  . Eczema 09/19/2010  . Constipation 09/19/2010    Plan: Patient will undergo surgical management with laparoscopic salpingectomy and removal of ectopic pregnancy.  The risks of surgery were discussed in detail with the patient including but not limited to: bleeding which may  require transfusion or reoperation, infection, injury to bowel or other surrounding organs, need for additional procedures including laparotomy; thromboembolic phenomenon, surgical site problems and other postoperative/anesthesia complications. Likelihood of success  in alleviating the patient's condition was discussed. Routine postoperative instructions will be reviewed with the patient and her family in detail after surgery.  The patient concurred with the proposed plan, giving informed written consent for the surgery.  Patient has been NPO since 1030 and she will remain NPO for procedure.  Anesthesia and OR aware.  Preoperative prophylactic antibiotics and SCDs ordered on call to the OR.  To OR when ready.   Jaynie Collins, MD, FACOG Obstetrician & Gynecologist, [facility] for [employer], [facility]

## 2017-03-30 NOTE — MAU Note (Signed)
Here for scheduled surgery for ectopic preg.  Pt stable, denies pain or bleeding.

## 2017-03-30 NOTE — Progress Notes (Signed)
MAU Note I went by to see patient b/c her CBC is back and she still has a low white count, so she will need surgery but patient is not in the lobby. MAU to let me know if patient comes back and if not, will call her later this afternoon.  [name] Cynthia Mcdowell, Jr MD Attending Center [facility] ([facility]ulty [facility]) 03/30/2017 Time: 1018am

## 2017-03-30 NOTE — Discharge Instructions (Signed)

## 2017-03-30 NOTE — MAU Note (Signed)
Pt here for follow up ultrasound for ectopic workup. No pain no bleeding.

## 2017-03-31 ENCOUNTER — Encounter (HOSPITAL_COMMUNITY): Payer: Self-pay | Admitting: Obstetrics & Gynecology

## 2017-04-13 ENCOUNTER — Ambulatory Visit (INDEPENDENT_AMBULATORY_CARE_PROVIDER_SITE_OTHER): Payer: Medicaid Other | Admitting: Obstetrics and Gynecology

## 2017-04-13 ENCOUNTER — Encounter: Payer: Self-pay | Admitting: Obstetrics and Gynecology

## 2017-04-13 VITALS — BP 116/72 | HR 84 | Wt 138.4 lb

## 2017-04-13 DIAGNOSIS — O00102 Left tubal pregnancy without intrauterine pregnancy: Secondary | ICD-10-CM

## 2017-04-13 DIAGNOSIS — D708 Other neutropenia: Secondary | ICD-10-CM

## 2017-04-13 NOTE — Progress Notes (Signed)
[facility] 04/13/2017  CC: regular post op visit  Subjective:   [facility]s left salpingectomy by dr Macon Largeanyanwu for left tubal ectopic. Pt discharged to home from pacu.  No issues, questions and pt is doing well  Objective:    BP 116/72   Pulse 84   Wt 138 lb 6.4 oz (62.8 kg)   LMP 01/25/2017   BMI 24.52 kg/m   NAD Abdomen: soft, nttp, nd. C/d/i l/s port sites. Umbilical honeycomb dressing removed  Assessment:    Doing well postoperatively.    Plan:  Doing well. Doesn't desire birth control. D/w her that if she gets pregnant in the future that she call an OB asap for early blood work and u/s  [employer] [employer] today for her leukopenia seen pre op  [place] Bingharlie Tymon Nemetz, Montez HagemanJr MD Attending Center for [facility](Faculty Practice)

## 2017-04-14 ENCOUNTER — Other Ambulatory Visit: Payer: Self-pay | Admitting: Obstetrics and Gynecology

## 2017-04-14 DIAGNOSIS — D708 Other neutropenia: Secondary | ICD-10-CM

## 2017-04-14 LAB — CBC WITH DIFFERENTIAL
BASOS ABS: 0 10*3/uL (ref 0.0–0.2)
BASOS: 0 %
EOS (ABSOLUTE): 0.2 10*3/uL (ref 0.0–0.4)
Eos: 6 %
Hematocrit: 35.7 % (ref 34.0–46.6)
Hemoglobin: 12.4 g/dL (ref 11.1–15.9)
IMMATURE GRANS (ABS): 0 10*3/uL (ref 0.0–0.1)
Immature Granulocytes: 0 %
LYMPHS: 36 %
Lymphocytes Absolute: 1.2 10*3/uL (ref 0.7–3.1)
MCH: 31.3 pg (ref 26.6–33.0)
MCHC: 34.7 g/dL (ref 31.5–35.7)
MCV: 90 fL (ref 79–97)
MONOS ABS: 0.3 10*3/uL (ref 0.1–0.9)
Monocytes: 9 %
NEUTROS ABS: 1.6 10*3/uL (ref 1.4–7.0)
NEUTROS PCT: 49 %
RBC: 3.96 x10E6/uL (ref 3.77–5.28)
RDW: 12.5 % (ref 12.3–15.4)
WBC: 3.2 10*3/uL — AB (ref 3.4–10.8)

## 2017-04-16 ENCOUNTER — Encounter: Payer: Self-pay | Admitting: General Practice

## 2017-04-16 ENCOUNTER — Encounter: Payer: Self-pay | Admitting: Internal Medicine

## 2017-04-16 ENCOUNTER — Telehealth: Payer: Self-pay | Admitting: Internal Medicine

## 2017-04-16 NOTE — Telephone Encounter (Signed)
[facility] office to schedule a hematology appt. Appt has been scheduled for the pt to see Dr. Arbutus PedMohamed on 4/16 at 1130am. Lyla SonCarrie will notify the pt. Letter mailed.

## 2017-04-20 ENCOUNTER — Telehealth: Payer: Self-pay | Admitting: General Practice

## 2017-04-20 NOTE — Telephone Encounter (Signed)
Called patient regarding mychart message and she states Dr Vergie LivingPickens already called her and answered all her questions. Patient had no questions for me today.

## 2017-04-29 ENCOUNTER — Encounter: Payer: Self-pay | Admitting: General Practice

## 2017-05-12 ENCOUNTER — Encounter: Payer: Medicaid Other | Admitting: Internal Medicine

## 2017-06-08 NOTE — Progress Notes (Signed)
HEMATOLOGY/ONCOLOGY CONSULTATION NOTE  Date of Service: 06/09/2017  Patient Care Team: Patient, No Pcp Per as PCP - General (General Practice)  CHIEF COMPLAINTS/PURPOSE OF CONSULTATION:  Leukopenia    HISTORY OF PRESENTING ILLNESS: 06/09/17   Cynthia Mcdowell is a wonderful 27 y.o. female who has been referred to [place] by her OBGYN/PCP Dr. Kerman Passey for evaluation and management of leukopenia.   This year she had a etopic pregnancy and underwent left salpingectomy for removal on 03/30/17 by Dr. Rosana Hoes and Dr. Bonney Leitz. At the times her WBC were low. However her WBC were low since at least 2016 based on Epic records. She notes this is the first time she was told she had low WBC.   She presents to the clinic today by herself. 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 She notes she eats mostly vegetables but will eat [place] and chicken. She denies being sick often. She denies having the flu or pneumonia. She notes her apprehension to anaesthesia. She notes to being a therapeutic minimalist and does not like to take medication.   She denies any past health concerns. She denies joint pain, mouth sores or skin rashes. She notes she has eczema which is mild for her. This will only be triggered by local reactions to certain materials. She notes to having well controlled asthma that can be triggered by cold air and pollen. She has not needed to be on steroids as an teen and adult. She notes she might have anemia but never diagnosed. She denies and Thyroid issues. She notes her etopic pregnancy was her first surgery.  She denies known family history of blood disorders. She is a non-smoker and drinks rarely. She denies drug use. 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 With peanuts she will get tongue swelling and hives. With shellfish she will get significant anaphylaxis. She uses epi-pen as needed. 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 She has not needed medication for this. She notes she has not had any outbreaks or concern for spreading.    On review of symptoms, pt denies fever, chills or signs of repeated infections. She denies abdominal pain. She denies current skin rashes or leg swelling.    MEDICAL HISTORY:  Past Medical History:  Diagnosis Date  . Allergy    seasonal, food  . Asthma   . Chlamydia contact, treated    Age 84   . Constipation 08/2008  . H/O candidiasis   . H/O varicella   . Hx of chlamydia infection    2010  . Irregular periods/menstrual cycles 08/10/2011    SURGICAL HISTORY: Past Surgical History:  Procedure Laterality Date  . LAPAROSCOPY N/A 03/30/2017   Procedure: LAPAROSCOPY OPERATIVE WITH LEFT SALPINGECTOMY;  Surgeon: Osborne Oman, MD;  Location: [facility];  Service: Gynecology;  Laterality: N/A;  . NO PAST SURGERIES      SOCIAL HISTORY: Social History   Socioeconomic History  . Marital status: Single    Spouse name: Not on file  . Number of children: Not on file  . Years of education: Not on file  . Highest education level: Not on file  Occupational History  . Not on file  Social Needs  . Financial resource strain: Not on file  . Food insecurity:    Worry: Not on file    Inability: Not on file  .  Transportation needs:    Medical: Not on file    Non-medical: Not on file  Tobacco Use  . Smoking status: Never Smoker  . Smokeless tobacco: Never Used  Substance and Sexual Activity  . Alcohol use: No  . Drug use: No  . Sexual activity: Yes    Partners: Male    Birth control/protection: None  Lifestyle  . Physical activity:    Days per week: Not on file    Minutes per session: Not on file  . Stress: Not on file  Relationships  . Social connections:    Talks on phone: Not on file    Gets together: Not on file    Attends religious service: Not on file    Active member of club or organization: Not on file    Attends meetings of  clubs or organizations: Not on file    Relationship status: Not on file  . Intimate partner violence:    Fear of current or ex partner: Not on file    Emotionally abused: Not on file    Physically abused: Not on file    Forced sexual activity: Not on file  Other Topics Concern  . Not on file  Social History Narrative  . Not on file    FAMILY HISTORY: Family History  Problem Relation Age of Onset  . Breast cancer Maternal Grandmother   . Cancer Maternal Grandmother   . Hypertension Mother   . Hyperlipidemia Father   . Heart disease Maternal Uncle   . Kidney disease Paternal Grandmother   . Anesthesia problems Neg Hx   . Hypotension Neg Hx   . Malignant hyperthermia Neg Hx   . Pseudochol deficiency Neg Hx     ALLERGIES:  is allergic to peanut-containing drug products; shellfish allergy; and latex.  MEDICATIONS:  Current Outpatient Medications  Medication Sig Dispense Refill  . albuterol (PROVENTIL HFA;VENTOLIN HFA) 108 (90 BASE) MCG/ACT inhaler Inhale 2 puffs into the lungs every 4 (four) hours as needed for wheezing. (Patient not taking: Reported on 04/13/2017) 6.7 g 0  . EPINEPHrine (EPIPEN) 0.3 mg/0.3 mL DEVI Inject 0.3 mLs (0.3 mg total) into the muscle once. (Patient not taking: Reported on 06/09/2017) 1 Device 3  . ibuprofen (ADVIL,MOTRIN) 800 MG tablet Take 1 tablet (800 mg total) by mouth 3 (three) times daily with meals as needed for headache, moderate pain or cramping. (Patient not taking: Reported on 04/13/2017) 30 tablet 3   No current facility-administered medications for this visit.     REVIEW OF SYSTEMS:    .10 Point review of Systems was done is negative except as noted above.  PHYSICAL EXAMINATION: ECOG PERFORMANCE STATUS: 0 - Asymptomatic  . Vitals:   06/09/17 1026  BP: 119/74  Pulse: 75  Resp: 17  Temp: 98.5 F (36.9 C)  SpO2: 100%   Filed Weights   06/09/17 1026  Weight: 135 lb 14.4 oz (61.6 kg)   .Body mass index is 24.07  kg/m.  GENERAL:alert, in no acute distress and comfortable SKIN: no acute rashes, no significant lesions EYES: conjunctiva are pink and non-injected, sclera anicteric OROPHARYNX: MMM, no exudates, no oropharyngeal erythema or ulceration NECK: supple, no JVD (+) mildly prominent thyroid  LYMPH:  no palpable lymphadenopathy in the cervical, axillary or inguinal regions LUNGS: clear to auscultation b/l with normal respiratory effort HEART: regular rate & rhythm ABDOMEN:  normoactive bowel sounds , non tender, not distended. No palpable hepato-splenomegaly. Extremity: no pedal edema. PSYCH: alert & oriented x 3  with fluent speech NEURO: no focal motor/sensory deficits  LABORATORY DATA:  I have reviewed the data as listed  . CBC Latest Ref Rng & Units 06/09/2017 06/09/2017 04/13/2017  WBC 3.9 - 10.3 K/uL 2.2(L) - 3.2(L)  Hemoglobin 11.6 - 15.9 g/dL 13.2 - 12.4  Hematocrit 34.0 - 46.6 % 38.8 37.7 35.7  Platelets 145 - 400 K/uL 175 - -  ANC 1000 . CBC    Component Value Date/Time   WBC 2.2 (L) 06/09/2017 1119   RBC 4.21 06/09/2017 1119   RBC 4.21 06/09/2017 1119   HGB 13.2 06/09/2017 1119   HGB 12.4 04/13/2017 1104   HCT 38.8 06/09/2017 1121   PLT 175 06/09/2017 1119   MCV 89.5 06/09/2017 1119   MCV 90 04/13/2017 1104   MCH 31.4 06/09/2017 1119   MCHC 35.0 06/09/2017 1119   RDW 12.1 06/09/2017 1119   RDW 12.5 04/13/2017 1104   LYMPHSABS 0.8 (L) 06/09/2017 1119   LYMPHSABS 1.2 04/13/2017 1104   MONOABS 0.2 06/09/2017 1119   EOSABS 0.2 06/09/2017 1119   EOSABS 0.2 04/13/2017 1104   BASOSABS 0.0 06/09/2017 1119   BASOSABS 0.0 04/13/2017 1104   Component     Latest Ref Rng & Units 02/04/2014 03/02/2017 03/09/2017 03/30/2017             WBC     3.9 - 10.3 K/uL 3.6 (L) 3.9 (L) 2.8 (L) 2.6 (L)  RBC     3.70 - 5.45 MIL/uL 4.20 4.16 4.06 4.16  Hemoglobin     11.6 - 15.9 g/dL 12.9 13.2 12.9 13.0  HCT     34.0 - 46.6 % 37.0 37.4 36.2 37.0  MCV     79.5 - 101.0 fL 88.1 89.9 89.2  88.9  MCH     25.1 - 34.0 pg 30.7 31.7 31.8 31.3  MCHC     31.5 - 36.0 g/dL 34.9 35.3 35.6 35.1  RDW     11.2 - 14.5 % 11.8 12.0 12.0 12.0  Neutrophils     % 45     Lymphs     Not Estab. %      Monocytes     Not Estab. %      Eos     Not Estab. %      Basos     Not Estab. %      NEUT#     1.5 - 6.5 K/uL 1.6 (L)     Lymphocyte #     0.9 - 3.3 K/uL 1.3     Monocytes Absolute     0.1 - 0.9 x10E3/uL      EOS (ABSOLUTE)     0.0 - 0.4 x10E3/uL      Basophils Absolute     0.0 - 0.1 K/uL 0.0     Immature Granulocytes     Not Estab. %      Immature Grans (Abs)     0.0 - 0.1 x10E3/uL      Platelets     145 - 400 K/uL 171 201 192 169  Lymphocytes     % 37     Monocytes Relative     % 15 (H)     Monocyte #     0.1 - 0.9 K/uL 0.6     Eosinophil     % 2     Eosinophils Absolute     0.0 - 0.5 K/uL 0.1     Basophil     % 1  Folate, Hemolysate     Not Estab. ng/mL      Folate, RBC     >498 ng/mL       Component     Latest Ref Rng & Units 04/13/2017 06/09/2017 06/09/2017         11:19 AM 11:21 AM  WBC     3.9 - 10.3 K/uL 3.2 (L) 2.2 (L)   RBC     3.70 - 5.45 MIL/uL 3.96 4.21   Hemoglobin     11.6 - 15.9 g/dL 12.4 13.2   HCT     34.0 - 46.6 % 35.7 37.7 38.8  MCV     79.5 - 101.0 fL 90 89.5   MCH     25.1 - 34.0 pg 31.3 31.4   MCHC     31.5 - 36.0 g/dL 34.7 35.0   RDW     11.2 - 14.5 % 12.5 12.1   Neutrophils     % 49 44   Lymphs     Not Estab. % 36    Monocytes     Not Estab. % 9    Eos     Not Estab. % 6    Basos     Not Estab. % 0    NEUT#     1.5 - 6.5 K/uL 1.6 1.0 (L)   Lymphocyte #     0.9 - 3.3 K/uL 1.2 0.8 (L)   Monocytes Absolute     0.1 - 0.9 x10E3/uL 0.3    EOS (ABSOLUTE)     0.0 - 0.4 x10E3/uL 0.2    Basophils Absolute     0.0 - 0.1 K/uL 0.0 0.0   Immature Granulocytes     Not Estab. % 0    Immature Grans (Abs)     0.0 - 0.1 x10E3/uL 0.0    Platelets     145 - 400 K/uL  175   Lymphocytes     %  37   Monocytes Relative     %   10   Monocyte #     0.1 - 0.9 K/uL  0.2   Eosinophil     %  9   Eosinophils Absolute     0.0 - 0.5 K/uL  0.2   Basophil     %  0   Folate, Hemolysate     Not Estab. ng/mL   345.6  Folate, RBC     >498 ng/mL   891    . CMP Latest Ref Rng & Units 06/09/2017 03/30/2017 02/04/2014  Glucose 70 - 140 mg/dL 83 86 97  BUN 7 - 26 mg/dL '10 10 9  ' Creatinine 0.60 - 1.10 mg/dL 0.76 0.63 0.73  Sodium 136 - 145 mmol/L 139 134(L) 136  Potassium 3.5 - 5.1 mmol/L 4.0 3.7 3.6  Chloride 98 - 109 mmol/L 105 101 100  CO2 22 - 29 mmol/L '27 25 28  ' Calcium 8.4 - 10.4 mg/dL 9.5 9.4 8.9  Total Protein 6.4 - 8.3 g/dL 7.9 8.1 7.1  Total Bilirubin 0.2 - 1.2 mg/dL 0.4 0.6 0.4  Alkaline Phos 40 - 150 U/L 107 81 98  AST 5 - 34 U/L '17 19 23  ' ALT 0 - 55 U/L 13 12(L) 11   Component     Latest Ref Rng & Units 06/09/2017  Iron     41 - 142 ug/dL 78  TIBC     236 - 444 ug/dL 404  Saturation Ratios     21 -  57 % 19 (L)  UIBC     ug/dL 325  TSH     0.450 - 4.500 uIU/mL 1.610  Thyroxine (T4)     4.5 - 12.0 ug/dL 8.1  T3 Uptake Ratio     24 - 39 % 26  Free Thyroxine Index     1.2 - 4.9 2.1  Retic Ct Pct     0.7 - 2.1 % 0.8  RBC.     3.70 - 5.45 MIL/uL 4.21  Retic Count, Absolute     33.7 - 90.7 K/uL 33.7  Folate, Hemolysate     Not Estab. ng/mL 345.6  HCT     34.0 - 46.6 % 38.8  Folate, RBC     >498 ng/mL 891  Ferritin     9 - 269 ng/mL 19  Copper     72 - 166 ug/dL 134  Vitamin B12     180 - 914 pg/mL 355  LDH     125 - 245 U/L 167    RADIOGRAPHIC STUDIES: I have personally reviewed the radiological images as listed and agreed with the findings in the report. No results found.  ASSESSMENT & PLAN:  Cynthia Mcdowell is a 27 y.o. African-American female with    1. Chronic Leukopenia with neutropenia. No issues with frequent infections. No other associated cytopenias No other constitutional symptoms. No peripheral LNadenopathy or hepato-splneomegaly. No childhood hx or FHx of infection  issues/low WBC. Benign Ethnic Leukopenia ? Vs ?autoimmune neutropenia related to her Crohns or skin rashes. -Low WBC since 2016 as [facility].  Denies significant etoh use. PLAN:  -I discussed possible causes of hver leukpenia any other cytopenias.  -After discussing her past medical history and overall stable, mild leukopenia I do not have high suspicion for more serious causes of low WBC.  -I will get blood tests for baseline levels  - Will hold off on Bone Marrow Biopsy unless lab results show concern for bone marrow abnormality.  -Upon initial exam she had a mild prominent thyroid. I will also test her thyroid function as well.  (TFT's - WNL) -If results show no abnormalities she can be discharged. If her leukopenia remains stable we can see her 1 more time in 6 months before discharging her.  -infection prevention strategies discussed.  Labs today RTC with Dr Irene Limbo in 6 months with labs   All of the patients questions were answered with apparent satisfaction. The patient knows to call the clinic with any problems, questions or concerns.  I spent 40 minutes counseling the patient face to face. The total time spent in the appointment was 45 minutes and more than 50% was on counseling and direct patient cares.    Sullivan Lone MD [place] AAHIVMS [facility] Hematology/Oncology Physician [facility]  (Office):       [phone] (Work cell):  [phone] (Fax):           [phone]  06/09/2017 11:08 AM  This document serves as a record of services personally performed by Sullivan Lone, MD. It was created on his behalf by Joslyn Devon, a trained medical scribe. The creation of this record is based on the scribe's personal observations and the provider's statements to them.   .I have reviewed the above documentation for accuracy and completeness, and I agree with the above.  - .Brunetta Genera MD MS

## 2017-06-09 ENCOUNTER — Inpatient Hospital Stay: Payer: Medicaid Other | Attending: Internal Medicine | Admitting: Hematology

## 2017-06-09 ENCOUNTER — Inpatient Hospital Stay: Payer: Medicaid Other

## 2017-06-09 ENCOUNTER — Encounter: Payer: Self-pay | Admitting: Hematology

## 2017-06-09 VITALS — BP 119/74 | HR 75 | Temp 98.5°F | Resp 17 | Ht 63.0 in | Wt 135.9 lb

## 2017-06-09 DIAGNOSIS — Z79899 Other long term (current) drug therapy: Secondary | ICD-10-CM | POA: Insufficient documentation

## 2017-06-09 DIAGNOSIS — D72818 Other decreased white blood cell count: Secondary | ICD-10-CM

## 2017-06-09 DIAGNOSIS — J45909 Unspecified asthma, uncomplicated: Secondary | ICD-10-CM | POA: Diagnosis not present

## 2017-06-09 DIAGNOSIS — L309 Dermatitis, unspecified: Secondary | ICD-10-CM | POA: Insufficient documentation

## 2017-06-09 DIAGNOSIS — Z91010 Allergy to peanuts: Secondary | ICD-10-CM | POA: Diagnosis not present

## 2017-06-09 DIAGNOSIS — D709 Neutropenia, unspecified: Secondary | ICD-10-CM

## 2017-06-09 DIAGNOSIS — Z803 Family history of malignant neoplasm of breast: Secondary | ICD-10-CM | POA: Insufficient documentation

## 2017-06-09 LAB — CBC WITH DIFFERENTIAL/PLATELET
BASOS ABS: 0 10*3/uL (ref 0.0–0.1)
BASOS PCT: 0 %
EOS ABS: 0.2 10*3/uL (ref 0.0–0.5)
EOS PCT: 9 %
HEMATOCRIT: 37.7 % (ref 34.8–46.6)
Hemoglobin: 13.2 g/dL (ref 11.6–15.9)
Lymphocytes Relative: 37 %
Lymphs Abs: 0.8 10*3/uL — ABNORMAL LOW (ref 0.9–3.3)
MCH: 31.4 pg (ref 25.1–34.0)
MCHC: 35 g/dL (ref 31.5–36.0)
MCV: 89.5 fL (ref 79.5–101.0)
MONO ABS: 0.2 10*3/uL (ref 0.1–0.9)
MONOS PCT: 10 %
Neutro Abs: 1 10*3/uL — ABNORMAL LOW (ref 1.5–6.5)
Neutrophils Relative %: 44 %
PLATELETS: 175 10*3/uL (ref 145–400)
RBC: 4.21 MIL/uL (ref 3.70–5.45)
RDW: 12.1 % (ref 11.2–14.5)
WBC: 2.2 10*3/uL — ABNORMAL LOW (ref 3.9–10.3)

## 2017-06-09 LAB — CMP (CANCER CENTER ONLY)
ALT: 13 U/L (ref 0–55)
AST: 17 U/L (ref 5–34)
Albumin: 4.5 g/dL (ref 3.5–5.0)
Alkaline Phosphatase: 107 U/L (ref 40–150)
Anion gap: 7 (ref 3–11)
BILIRUBIN TOTAL: 0.4 mg/dL (ref 0.2–1.2)
BUN: 10 mg/dL (ref 7–26)
CO2: 27 mmol/L (ref 22–29)
Calcium: 9.5 mg/dL (ref 8.4–10.4)
Chloride: 105 mmol/L (ref 98–109)
Creatinine: 0.76 mg/dL (ref 0.60–1.10)
Glucose, Bld: 83 mg/dL (ref 70–140)
Potassium: 4 mmol/L (ref 3.5–5.1)
Sodium: 139 mmol/L (ref 136–145)
TOTAL PROTEIN: 7.9 g/dL (ref 6.4–8.3)

## 2017-06-09 LAB — IRON AND TIBC
Iron: 78 ug/dL (ref 41–142)
SATURATION RATIOS: 19 % — AB (ref 21–57)
TIBC: 404 ug/dL (ref 236–444)
UIBC: 325 ug/dL

## 2017-06-09 LAB — RETICULOCYTES
RBC.: 4.21 MIL/uL (ref 3.70–5.45)
Retic Count, Absolute: 33.7 10*3/uL (ref 33.7–90.7)
Retic Ct Pct: 0.8 % (ref 0.7–2.1)

## 2017-06-09 LAB — FERRITIN: FERRITIN: 19 ng/mL (ref 9–269)

## 2017-06-09 LAB — VITAMIN B12: Vitamin B-12: 355 pg/mL (ref 180–914)

## 2017-06-09 LAB — LACTATE DEHYDROGENASE: LDH: 167 U/L (ref 125–245)

## 2017-06-10 LAB — FOLATE RBC
FOLATE, RBC: 891 ng/mL (ref >498)
Folate, Hemolysate: 345.6 ng/mL
Hematocrit: 38.8 % (ref 34.0–46.6)

## 2017-06-10 LAB — THYROID PANEL WITH TSH
Free Thyroxine Index: 2.1 (ref 1.2–4.9)
T3 UPTAKE RATIO: 26 % (ref 24–39)
T4, Total: 8.1 ug/dL (ref 4.5–12.0)
TSH: 1.61 u[IU]/mL (ref 0.450–4.500)

## 2017-06-11 LAB — COPPER, SERUM: COPPER: 134 ug/dL (ref 72–166)

## 2017-07-15 ENCOUNTER — Inpatient Hospital Stay (HOSPITAL_COMMUNITY)
Admission: AD | Admit: 2017-07-15 | Discharge: 2017-07-15 | Disposition: A | Discharge status: Home / Self Care | Payer: Medicaid Other | Source: Ambulatory Visit | Attending: Obstetrics & Gynecology | Admitting: Obstetrics & Gynecology

## 2017-07-15 ENCOUNTER — Encounter (HOSPITAL_COMMUNITY): Payer: Self-pay | Admitting: *Deleted

## 2017-07-15 ENCOUNTER — Other Ambulatory Visit: Payer: Self-pay

## 2017-07-15 DIAGNOSIS — J45909 Unspecified asthma, uncomplicated: Secondary | ICD-10-CM | POA: Diagnosis not present

## 2017-07-15 DIAGNOSIS — N912 Amenorrhea, unspecified: Secondary | ICD-10-CM | POA: Insufficient documentation

## 2017-07-15 DIAGNOSIS — R12 Heartburn: Secondary | ICD-10-CM | POA: Diagnosis present

## 2017-07-15 DIAGNOSIS — K219 Gastro-esophageal reflux disease without esophagitis: Secondary | ICD-10-CM | POA: Diagnosis not present

## 2017-07-15 DIAGNOSIS — N926 Irregular menstruation, unspecified: Secondary | ICD-10-CM

## 2017-07-15 DIAGNOSIS — Z79899 Other long term (current) drug therapy: Secondary | ICD-10-CM | POA: Insufficient documentation

## 2017-07-15 LAB — URINALYSIS, ROUTINE W REFLEX MICROSCOPIC
Bilirubin Urine: NEGATIVE
GLUCOSE, UA: NEGATIVE mg/dL
HGB URINE DIPSTICK: NEGATIVE
Ketones, ur: NEGATIVE mg/dL
Nitrite: NEGATIVE
PROTEIN: NEGATIVE mg/dL
Specific Gravity, Urine: 1.011 (ref 1.005–1.030)
pH: 8 (ref 5.0–8.0)

## 2017-07-15 LAB — HCG, QUANTITATIVE, PREGNANCY: hCG, Beta Chain, Quant, S: <1 m[IU]/mL (ref <5)

## 2017-07-15 LAB — POCT PREGNANCY, URINE: Preg Test, Ur: NEGATIVE

## 2017-07-15 MED ORDER — GI COCKTAIL ~~LOC~~
30.0000 mL | Freq: Once | ORAL | Status: AC
  Administered 2017-07-15: 30 mL via ORAL
  Filled 2017-07-15: qty 30

## 2017-07-15 NOTE — MAU Note (Signed)
Pt was given GI cocktail and was reevaluated for chest pain. Pt denies chest pain after the medication.

## 2017-07-15 NOTE — MAU Provider Note (Signed)
Chief Complaint: Heartburn and Possible Pregnancy   SUBJECTIVE HPI: Cynthia Mcdowell is a 27 y.o. G2P1001 at Unknown who presents to MAU 2 weeks late on menses.  Had ectopic in 03/08/2017 pt stated she is worried she has another one. Pt denies this feeling the same way, but endorses just being scared.  Pt denies using birth control, endorses being sexually active with one female partner for the last 3 years. Pt endorse LMP was 06/04/2017. Pt denies ever missing menses before. Pt denies current having abdominal no vaginal bleeding, no discharge, denies dysuria. Pt endorses have a bowel movement x 1 day and normal for her. Pt endorses having a bout of acid reflux for 2 weeks. Pt denies being stress, no diet change, no excessive, no big life event. Pt ok with getting pregnant, but doesn't want to seek medical treatment if unable to right now.   Past Medical History:  Diagnosis Date  . Allergy    seasonal, food  . Asthma   . Chlamydia contact, treated    Age 68   . Constipation 08/2008  . H/O candidiasis   . H/O varicella   . Hx of chlamydia infection    2010  . Irregular periods/menstrual cycles 08/10/2011   OB History  Gravida Para Term Preterm AB Living  2 1 1     1   SAB TAB Ectopic Multiple Live Births          1    # Outcome Date GA Lbr Len/2nd Weight Sex Delivery Anes PTL Lv  2 Gravida           1 Term 06/24/11 [redacted]w[redacted]d 03:36 / 00:02 3.209 kg (7 lb 1.2 oz) F Vag-Spont Local  LIV   Past Surgical History:  Procedure Laterality Date  . LAPAROSCOPY N/A 03/30/2017   Procedure: LAPAROSCOPY OPERATIVE WITH LEFT SALPINGECTOMY;  Surgeon: Tereso Newcomer, MD;  Location: [facility];  Service: Gynecology;  Laterality: N/A;  . NO PAST SURGERIES     Social History   Socioeconomic History  . Marital status: Single    Spouse name: Not on file  . Number of children: Not on file  . Years of education: Not on file  . Highest education level: Not on file  Occupational History  . Not on file  Social  Needs  . Financial resource strain: Not on file  . Food insecurity:    Worry: Not on file    Inability: Not on file  . Transportation needs:    Medical: Not on file    Non-medical: Not on file  Tobacco Use  . Smoking status: Never Smoker  . Smokeless tobacco: Never Used  Substance and Sexual Activity  . Alcohol use: No  . Drug use: No  . Sexual activity: Yes    Partners: Male    Birth control/protection: None  Lifestyle  . Physical activity:    Days per week: Not on file    Minutes per session: Not on file  . Stress: Not on file  Relationships  . Social connections:    Talks on phone: Not on file    Gets together: Not on file    Attends religious service: Not on file    Active member of club or organization: Not on file    Attends meetings of clubs or organizations: Not on file    Relationship status: Not on file  . Intimate partner violence:    Fear of current or ex partner: Not on file  Emotionally abused: Not on file    Physically abused: Not on file    Forced sexual activity: Not on file  Other Topics Concern  . Not on file  Social History Narrative  . Not on file   No current facility-administered medications on file prior to encounter.    Current Outpatient Medications on File Prior to Encounter  Medication Sig Dispense Refill  . albuterol (PROVENTIL HFA;VENTOLIN HFA) 108 (90 BASE) MCG/ACT inhaler Inhale 2 puffs into the lungs every 4 (four) hours as needed for wheezing. (Patient not taking: Reported on 04/13/2017) 6.7 g 0  . EPINEPHrine (EPIPEN) 0.3 mg/0.3 mL DEVI Inject 0.3 mLs (0.3 mg total) into the muscle once. (Patient not taking: Reported on 06/09/2017) 1 Device 3  . ibuprofen (ADVIL,MOTRIN) 800 MG tablet Take 1 tablet (800 mg total) by mouth 3 (three) times daily with meals as needed for headache, moderate pain or cramping. (Patient not taking: Reported on 04/13/2017) 30 tablet 3   Allergies  Allergen Reactions  . Peanut Oil Anaphylaxis  .  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 Shellfish Allergy Anaphylaxis  . Latex     I have reviewed the past Medical Hx, Surgical Hx, Social Hx, Allergies and Medications.   REVIEW OF SYSTEMS All systems reviewed and are negative for acute change except as noted in the HPI.   OBJECTIVE BP 113/82 (BP Location: Right Arm)   Pulse 83   Temp 98.6 F (37 C) (Oral)   Resp 16   Wt 63.5 kg (140 lb)   LMP 06/04/2017   SpO2 100%   BMI 24.80 kg/m    PHYSICAL EXAM Constitutional: Well-developed, well-nourished female in no acute distress.  Cardiovascular: normal rate and rhythm, pulses intact Respiratory: normal rate and effort.  GI: Abd soft, non-tender, non-distended. Pos BS x 4 MS: Extremities nontender, no edema, normal ROM Neurologic: Alert and oriented x 4. No focal deficits GU: Neg CVAT. SPECULUM EXAM: Defered BIMANUAL: cervix : deferred  Psych: normal mood and affect  LAB RESULTS Results for orders placed or performed during the hospital encounter of 07/15/17 (from the past 24 hour(s))  Urinalysis, Routine w reflex microscopic     Status: Abnormal   Collection Time: 07/15/17  7:08 PM  Result Value Ref Range   Color, Urine YELLOW YELLOW   APPearance HAZY (A) CLEAR   Specific Gravity, Urine 1.011 1.005 - 1.030   pH 8.0 5.0 - 8.0   Glucose, UA NEGATIVE NEGATIVE mg/dL   Hgb urine dipstick NEGATIVE NEGATIVE   Bilirubin Urine NEGATIVE NEGATIVE   Ketones, ur NEGATIVE NEGATIVE mg/dL   Protein, ur NEGATIVE NEGATIVE mg/dL   Nitrite NEGATIVE NEGATIVE   Leukocytes, UA MODERATE (A) NEGATIVE   RBC / HPF 0-5 0 - 5 RBC/hpf   WBC, UA 6-10 0 - 5 WBC/hpf   Bacteria, UA RARE (A) NONE SEEN   Squamous Epithelial / LPF 11-20 0 - 5  Pregnancy, urine POC     Status: None   Collection Time: 07/15/17  7:18 PM  Result Value Ref Range   Preg Test, Ur NEGATIVE NEGATIVE  hCG, quantitative, pregnancy     Status: None   Collection Time: 07/15/17  7:51 PM   Result Value Ref Range   hCG, Beta Chain, Quant, S <1 <5 mIU/mL    IMAGING No results found.  MAU Management/MDM: Vitals and nursing notes reviewed Orders Placed This Encounter  Procedures  . Urinalysis, Routine w reflex microscopic  .  hCG, quantitative, pregnancy  . Diet - low sodium heart healthy  . Increase activity slowly  . Pregnancy, urine POC  . Discharge patient Discharge disposition: 01-Home or Self Care; Discharge patient date: 07/15/2017    Meds ordered this encounter  Medications  . gi cocktail (Maalox,Lidocaine,Donnatal)    Plan of care reviewed with patient, including labs and tests ordered and medical treatment.  Consult Dr Mora Appl.  Treatments in MAU included UA-, GI cocktail with improvement, Bhcg-, d/c home in stable condition.   ASSESSMENT 1. Missed menses   2. Gastroesophageal reflux disease without esophagitis     PLAN Discharge home in stable condition. Take OTC zantac for heart burn with bland diet.  Pt discharged with strict bleeding and cramping precautions. Counseled on return precautions Handout given follow up with [facility] in 2 week if no menses.   Follow-up Information    [facility] Obstetrics & Gynecology Follow up.   Specialty:  Obstetrics and Gynecology Why:  2 week at office if no menses Contact information: [address] [place] [address] [phone]          Dale Wade Hampton  07/15/2017, 9:42 PM

## 2017-07-15 NOTE — MAU Note (Signed)
Been having some chest pains, feels like heartburn.  Period is 2 wks, did 2 test over a wk ago- they were negative.  Kind of scared, cause earlier this year she had an ectopic. No bleeding. Only nauseous in the morning.

## 2017-08-26 ENCOUNTER — Inpatient Hospital Stay (HOSPITAL_COMMUNITY)
Admission: AD | Admit: 2017-08-26 | Discharge: 2017-08-26 | Discharge status: Against Medical Advice | Payer: Medicaid Other | Source: Ambulatory Visit | Attending: Obstetrics and Gynecology | Admitting: Obstetrics and Gynecology

## 2017-08-26 ENCOUNTER — Inpatient Hospital Stay (HOSPITAL_COMMUNITY): Payer: Medicaid Other

## 2017-08-26 ENCOUNTER — Encounter (HOSPITAL_COMMUNITY): Payer: Self-pay

## 2017-08-26 DIAGNOSIS — O9989 Other specified diseases and conditions complicating pregnancy, childbirth and the puerperium: Secondary | ICD-10-CM | POA: Diagnosis not present

## 2017-08-26 DIAGNOSIS — Z3A01 Less than 8 weeks gestation of pregnancy: Secondary | ICD-10-CM | POA: Diagnosis not present

## 2017-08-26 DIAGNOSIS — Z8759 Personal history of other complications of pregnancy, childbirth and the puerperium: Secondary | ICD-10-CM

## 2017-08-26 DIAGNOSIS — O0910 Supervision of pregnancy with history of ectopic or molar pregnancy, unspecified trimester: Secondary | ICD-10-CM

## 2017-08-26 LAB — URINALYSIS, ROUTINE W REFLEX MICROSCOPIC
Bilirubin Urine: NEGATIVE
Glucose, UA: NEGATIVE mg/dL
Hgb urine dipstick: NEGATIVE
Ketones, ur: NEGATIVE mg/dL
LEUKOCYTES UA: NEGATIVE
Nitrite: NEGATIVE
PH: 5 (ref 5.0–8.0)
Protein, ur: NEGATIVE mg/dL
SPECIFIC GRAVITY, URINE: 1.025 (ref 1.005–1.030)

## 2017-08-26 LAB — POCT PREGNANCY, URINE: Preg Test, Ur: POSITIVE — AB

## 2017-08-26 LAB — HCG, QUANTITATIVE, PREGNANCY: hCG, Beta Chain, Quant, S: 32018 m[IU]/mL — ABNORMAL HIGH (ref <5)

## 2017-08-26 NOTE — MAU Provider Note (Signed)
Chief Complaint: No chief complaint on file.   SUBJECTIVE HPI: Cynthia Mcdowell is a 27 y.o. G3P1001 at [redacted]w[redacted]d who presents to MAU for positive pregnancy test at home, h/o ectopic this year and worried about this being thie being the same. Pt denies bleeding, leakage, no abdominal cramps or pain, no n, v, d, rashes, fever, cp or sob.    Past Medical History:  Diagnosis Date  . Allergy    seasonal, food  . Asthma   . Chlamydia contact, treated    Age 30   . Constipation 08/2008  . H/O candidiasis   . H/O varicella   . Hx of chlamydia infection    2010  . Irregular periods/menstrual cycles 08/10/2011   OB History  Gravida Para Term Preterm AB Living  3 1 1     1   SAB TAB Ectopic Multiple Live Births          1    # Outcome Date GA Lbr Len/2nd Weight Sex Delivery Anes PTL Lv  3 Current           2 Term 06/24/11 [redacted]w[redacted]d 03:36 / 00:02 3.209 kg (7 lb 1.2 oz) F Vag-Spont Local  LIV  1 Gravida            Past Surgical History:  Procedure Laterality Date  . LAPAROSCOPY N/A 03/30/2017   Procedure: LAPAROSCOPY OPERATIVE WITH LEFT SALPINGECTOMY;  Surgeon: Tereso Newcomer, MD;  Location: [facility];  Service: Gynecology;  Laterality: N/A;  . NO PAST SURGERIES     Social History   Socioeconomic History  . Marital status: Single    Spouse name: Not on file  . Number of children: Not on file  . Years of education: Not on file  . Highest education level: Not on file  Occupational History  . Not on file  Social Needs  . Financial resource strain: Not on file  . Food insecurity:    Worry: Not on file    Inability: Not on file  . Transportation needs:    Medical: Not on file    Non-medical: Not on file  Tobacco Use  . Smoking status: Never Smoker  . Smokeless tobacco: Never Used  Substance and Sexual Activity  . Alcohol use: No  . Drug use: No  . Sexual activity: Yes    Partners: Male    Birth control/protection: None  Lifestyle  . Physical activity:    Days per week: Not on file     Minutes per session: Not on file  . Stress: Not on file  Relationships  . Social connections:    Talks on phone: Not on file    Gets together: Not on file    Attends religious service: Not on file    Active member of club or organization: Not on file    Attends meetings of clubs or organizations: Not on file    Relationship status: Not on file  . Intimate partner violence:    Fear of current or ex partner: Not on file    Emotionally abused: Not on file    Physically abused: Not on file    Forced sexual activity: Not on file  Other Topics Concern  . Not on file  Social History Narrative  . Not on file   No current facility-administered medications on file prior to encounter.    Current Outpatient Medications on File Prior to Encounter  Medication Sig Dispense Refill  . albuterol (PROVENTIL HFA;VENTOLIN HFA) 108 (90  BASE) MCG/ACT inhaler Inhale 2 puffs into the lungs every 4 (four) hours as needed for wheezing. (Patient not taking: Reported on 04/13/2017) 6.7 g 0  . EPINEPHrine (EPIPEN) 0.3 mg/0.3 mL DEVI Inject 0.3 mLs (0.3 mg total) into the muscle once. (Patient not taking: Reported on 06/09/2017) 1 Device 3  . ibuprofen (ADVIL,MOTRIN) 800 MG tablet Take 1 tablet (800 mg total) by mouth 3 (three) times daily with meals as needed for headache, moderate pain or cramping. (Patient not taking: Reported on 04/13/2017) 30 tablet 3   Allergies  Allergen Reactions  . Peanut Oil Anaphylaxis  . 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 Shellfish Allergy Anaphylaxis  . Latex     I have reviewed the past Medical Hx, Surgical Hx, Social Hx, Allergies and Medications.   REVIEW OF SYSTEMS All systems reviewed and are negative for acute change except as noted in the HPI.   OBJECTIVE BP (!) 115/91 (BP Location: Right Arm)   Pulse (!) 106   Temp 98.5 F (36.9 C) (Oral)   Resp 16   Wt 63 kg (139 lb)   LMP 07/21/2017   SpO2 100%   BMI 24.62 kg/m     PHYSICAL EXAM Pt left AMA without PE exam.   LAB RESULTS Results for orders placed or performed during the hospital encounter of 08/26/17 (from the past 24 hour(s))  Urinalysis, Routine w reflex microscopic     Status: Abnormal   Collection Time: 08/26/17  9:39 AM  Result Value Ref Range   Color, Urine YELLOW YELLOW   APPearance HAZY (A) CLEAR   Specific Gravity, Urine 1.025 1.005 - 1.030   pH 5.0 5.0 - 8.0   Glucose, UA NEGATIVE NEGATIVE mg/dL   Hgb urine dipstick NEGATIVE NEGATIVE   Bilirubin Urine NEGATIVE NEGATIVE   Ketones, ur NEGATIVE NEGATIVE mg/dL   Protein, ur NEGATIVE NEGATIVE mg/dL   Nitrite NEGATIVE NEGATIVE   Leukocytes, UA NEGATIVE NEGATIVE  Pregnancy, urine POC     Status: Abnormal   Collection Time: 08/26/17  9:44 AM  Result Value Ref Range   Preg Test, Ur POSITIVE (A) NEGATIVE  hCG, quantitative, pregnancy     Status: Abnormal   Collection Time: 08/26/17 10:14 AM  Result Value Ref Range   hCG, Beta Chain, Quant, S 32,018 (H) <5 mIU/mL    IMAGING US Ob Comp Less 14 Wks  Result Date: 08/26/2017 CLINICAL DATA:  Early pregnancy. Unsure of LMP. Recent left salpingectomy for ectopic pregnancy. EXAM: OBSTETRIC <14 WK ULTRASOUND TECHNIQUE: Transabdominal ultrasound was performed for evaluation of the gestation as well as the maternal uterus and adnexal regions. COMPARISON:  03/30/2017 FINDINGS: Intrauterine gestational sac: Single Yolk sac:  Visualized. Embryo: Not visualized, although resolution limited on transabdominal sonography MSD: 11 mm   5 w   6 d Subchorionic hemorrhage:  None visualized. Maternal uterus/adnexae: Both ovaries are normal in appearance. No adnexal mass identified on transabdominal sonography. Small amount of simple free fluid seen in pelvic cul-de-sac. IMPRESSION: Single intrauterine gestational sac measuring 5 weeks 6 days by mean sac diameter. Small amount of free fluid, however no adnexal mass identified by transabdominal sonography.  Electronically Signed   By: Myles Rosenthal M.D.   On: 08/26/2017 12:15    MAU Management/MDM: Vitals and nursing notes reviewed Orders Placed This Encounter  Procedures  . US OB Comp Less 14 Wks  . Urinalysis, Routine w reflex microscopic  . hCG, quantitative, pregnancy  . Pregnancy, urine POC  No orders of the defined types were placed in this encounter.   Plan of care reviewed with patient, including labs and tests ordered and medical treatment.  Treatments in MAU included see above.   ASSESSMENT HPI: Donnajean Lopesngela B Ryner is a 27 y.o. G3P1001 at 2689w1d who presents to MAU for positive pregnancy test at home, h/o ectopic this year, bet hcg 32,000. Pt left AMA due to court date today. Pt walked out in NAD in stable condition.    IMPRESSION: Single intrauterine gestational sac measuring 5 weeks 6 days by mean sac diameter. Small amount of free fluid, however no adnexal mass identified by transabdominal sonography. 1. Less than [redacted] weeks gestation of pregnancy   2. Pregnancy affected by previous ectopic pregnancy   3. History of ectopic pregnancy     PLAN F/U for NOB Discharge home in stable condition. Pt discharged with strict cramping and bleeding precautions. Counseled on return precautions Handout given  Follow-up Information    [facility] .   Specialty:  Radiology Contact information: [address] [id number] [place] [phone]       [facility] Follow up in 2 week(s).   Specialty:  Obstetrics and Gynecology Why:  NOB  Contact information: [address] [place] [phone]          Dale DurhamMONTANA, Shanika Levings  08/26/2017, 2:24 PM

## 2017-08-26 NOTE — MAU Note (Signed)
Pt had + HPT today and has history of ectopic so is worried about that again. Pt is having no pain or bleeding.

## 2017-08-26 NOTE — MAU Note (Signed)
Pt had to leave because she had court at 1330. Pt signed AMA form, told patient to call office for follow up.

## 2017-09-04 LAB — OB RESULTS CONSOLE HIV ANTIBODY (ROUTINE TESTING): HIV: NONREACTIVE

## 2017-09-04 LAB — OB RESULTS CONSOLE HEPATITIS B SURFACE ANTIGEN: Hepatitis B Surface Ag: NEGATIVE

## 2017-09-04 LAB — OB RESULTS CONSOLE RUBELLA ANTIBODY, IGM: Rubella: IMMUNE

## 2017-09-07 LAB — OB RESULTS CONSOLE GC/CHLAMYDIA
Chlamydia: NEGATIVE
Gonorrhea: NEGATIVE

## 2017-12-03 ENCOUNTER — Telehealth: Payer: Self-pay | Admitting: Hematology

## 2017-12-03 NOTE — Telephone Encounter (Signed)
GK out 11/14 - per GK moved labf/u four weeks out. Left message re 12/12 lab/fu. Schedule mailed.

## 2017-12-10 ENCOUNTER — Other Ambulatory Visit: Payer: Medicaid Other

## 2017-12-10 ENCOUNTER — Ambulatory Visit: Payer: Medicaid Other | Admitting: Hematology

## 2018-01-07 ENCOUNTER — Inpatient Hospital Stay: Payer: Medicaid Other | Admitting: Hematology

## 2018-01-07 ENCOUNTER — Inpatient Hospital Stay: Payer: Medicaid Other | Attending: Hematology

## 2018-01-27 NOTE — L&D Delivery Note (Signed)
Delivery Note At  a viable female was delivered via  (Presentation:OA ;  ).  APGAR:9 ,9 ; weight pending  .   Placenta status:complete , . 3V Cord:  with the following complications:None .  Anesthesia:  None Episiotomy:  NA Lacerations:  NA  Est. Blood Loss (mL): 100    Mom to postpartum.  Baby to Couplet care / Skin to Skin.  Henderson Newcomer Prothero 04/18/2018, 8:33 AM

## 2018-03-31 LAB — OB RESULTS CONSOLE GBS: GBS: NEGATIVE

## 2018-04-12 ENCOUNTER — Inpatient Hospital Stay (HOSPITAL_COMMUNITY)
Admission: AD | Admit: 2018-04-12 | Discharge: 2018-04-12 | Disposition: A | Discharge status: Home / Self Care | Payer: Medicaid Other | Attending: Obstetrics and Gynecology | Admitting: Obstetrics and Gynecology

## 2018-04-12 ENCOUNTER — Encounter (HOSPITAL_COMMUNITY): Payer: Self-pay

## 2018-04-12 DIAGNOSIS — O479 False labor, unspecified: Secondary | ICD-10-CM | POA: Diagnosis not present

## 2018-04-12 DIAGNOSIS — Z3A38 38 weeks gestation of pregnancy: Secondary | ICD-10-CM | POA: Insufficient documentation

## 2018-04-12 DIAGNOSIS — O471 False labor at or after 37 completed weeks of gestation: Secondary | ICD-10-CM | POA: Diagnosis not present

## 2018-04-12 NOTE — Discharge Instructions (Signed)

## 2018-04-12 NOTE — MAU Provider Note (Signed)
S: Cynthia Mcdowell is a 28 year old G3P1001 at 38.3 weeks who presents for contractions.  Reports she was 3cm in the office.  Nurse reports no cervical change.  Strip & Chart Reviewed.  O:  Vitals:   04/12/18 1921 04/12/18 1953 04/12/18 2006  BP: 112/68  117/69  Pulse: (!) 109  94  Resp: 18    Temp:  98.3 F (36.8 C)   TempSrc:  Oral   SpO2: 100%    Weight: 79.1 kg    Height: 5\' 4"  (1.626 m)     No results found for this or any previous visit (from the past 24 hour(s)).  Dilation: 3 Effacement (%): 70 Cervical Position: Middle Station: -2 Presentation: Vertex Exam by:: T LYTLE RN    FHR: 135 bpm, Mod Var, - Decels, + Accels UC: Occasional   A: IUP at 38.3 wks Cat I FT Contactions NST   P: Strip reviewed-NST Reactive Nurse instructed: -Okay to discharge patient -Provide Labor Precautions  Sabas Sous, MSN, CNM 9:58 PM

## 2018-04-12 NOTE — MAU Note (Signed)
I have communicated with Sabas Sous CNM and reviewed vital signs:  Vitals:   04/12/18 2006 04/12/18 2158  BP: 117/69 119/62  Pulse: 94   Resp:    Temp:    SpO2:      Vaginal exam:  Dilation: 3 Effacement (%): 70 Cervical Position: Middle Station: -2 Presentation: Vertex Exam by:: T Margie Urbanowicz RN ,   Also reviewed contraction pattern and that non-stress test is reactive.  It has been documented that patient is contracting infrequently minutes with no cervical change over 1.5  hours not indicating active labor.  Patient denies any other complaints.  Based on this report provider has given order for discharge.  A discharge order and diagnosis entered by a provider.   Labor discharge instructions reviewed with patient.

## 2018-04-12 NOTE — MAU Note (Signed)
Pt reports cramping and mild contractions. States they started Sunday, but worse today. Was 3cm last Monday. Pt denies vaginal bleeding or LOF. Reports good fetal movement.

## 2018-04-18 ENCOUNTER — Encounter (HOSPITAL_COMMUNITY): Payer: Self-pay

## 2018-04-18 ENCOUNTER — Inpatient Hospital Stay (HOSPITAL_COMMUNITY)
Admission: AD | Admit: 2018-04-18 | Discharge: 2018-04-19 | DRG: 807 | Disposition: A | Discharge status: Home / Self Care | Payer: Medicaid Other | Attending: Obstetrics and Gynecology | Admitting: Obstetrics and Gynecology

## 2018-04-18 ENCOUNTER — Other Ambulatory Visit: Payer: Self-pay

## 2018-04-18 DIAGNOSIS — O9952 Diseases of the respiratory system complicating childbirth: Secondary | ICD-10-CM | POA: Diagnosis present

## 2018-04-18 DIAGNOSIS — Z3A39 39 weeks gestation of pregnancy: Secondary | ICD-10-CM

## 2018-04-18 DIAGNOSIS — J45909 Unspecified asthma, uncomplicated: Secondary | ICD-10-CM | POA: Diagnosis present

## 2018-04-18 DIAGNOSIS — O26893 Other specified pregnancy related conditions, third trimester: Secondary | ICD-10-CM | POA: Diagnosis present

## 2018-04-18 LAB — CBC
HCT: 34.2 % — ABNORMAL LOW (ref 36.0–46.0)
Hemoglobin: 11.4 g/dL — ABNORMAL LOW (ref 12.0–15.0)
MCH: 29.3 pg (ref 26.0–34.0)
MCHC: 33.3 g/dL (ref 30.0–36.0)
MCV: 87.9 fL (ref 80.0–100.0)
Platelets: 144 10*3/uL — ABNORMAL LOW (ref 150–400)
RBC: 3.89 MIL/uL (ref 3.87–5.11)
RDW: 12.6 % (ref 11.5–15.5)
WBC: 4.4 10*3/uL (ref 4.0–10.5)
nRBC: 0 % (ref 0.0–0.2)

## 2018-04-18 LAB — TYPE AND SCREEN
ABO/RH(D): B POS
Antibody Screen: NEGATIVE

## 2018-04-18 LAB — ABO/RH: ABO/RH(D): B POS

## 2018-04-18 LAB — RPR: RPR Ser Ql: NONREACTIVE

## 2018-04-18 MED ORDER — DIBUCAINE 1 % RE OINT: 1.0000 "application " | TOPICAL_OINTMENT | RECTAL | Status: DC | PRN

## 2018-04-18 MED ORDER — OXYCODONE-ACETAMINOPHEN 5-325 MG PO TABS: 2.0000 | ORAL_TABLET | ORAL | Status: DC | PRN

## 2018-04-18 MED ORDER — BENZOCAINE-MENTHOL 20-0.5 % EX AERO
1.0000 "application " | INHALATION_SPRAY | CUTANEOUS | Status: DC | PRN
  Administered 2018-04-18: 1 via TOPICAL
  Filled 2018-04-18: qty 56

## 2018-04-18 MED ORDER — LACTATED RINGERS IV SOLN
INTRAVENOUS | Status: DC
  Administered 2018-04-18: 08:00:00 via INTRAVENOUS

## 2018-04-18 MED ORDER — OXYTOCIN 40 UNITS IN NORMAL SALINE INFUSION - SIMPLE MED
2.5000 [IU]/h | INTRAVENOUS | Status: DC
  Filled 2018-04-18: qty 1000

## 2018-04-18 MED ORDER — ACETAMINOPHEN 325 MG PO TABS: 650.0000 mg | ORAL_TABLET | ORAL | Status: DC | PRN

## 2018-04-18 MED ORDER — SIMETHICONE 80 MG PO CHEW: 80.0000 mg | CHEWABLE_TABLET | ORAL | Status: DC | PRN

## 2018-04-18 MED ORDER — TETANUS-DIPHTH-ACELL PERTUSSIS 5-2.5-18.5 LF-MCG/0.5 IM SUSP: 0.5000 mL | Freq: Once | INTRAMUSCULAR | Status: DC

## 2018-04-18 MED ORDER — OXYCODONE-ACETAMINOPHEN 5-325 MG PO TABS: 1.0000 | ORAL_TABLET | ORAL | Status: DC | PRN

## 2018-04-18 MED ORDER — ONDANSETRON HCL 4 MG PO TABS: 4.0000 mg | ORAL_TABLET | ORAL | Status: DC | PRN

## 2018-04-18 MED ORDER — IBUPROFEN 600 MG PO TABS
600.0000 mg | ORAL_TABLET | Freq: Four times a day (QID) | ORAL | Status: DC
  Administered 2018-04-18 – 2018-04-19 (×3): 600 mg via ORAL
  Filled 2018-04-18 (×3): qty 1

## 2018-04-18 MED ORDER — ONDANSETRON HCL 4 MG/2ML IJ SOLN: 4.0000 mg | Freq: Four times a day (QID) | INTRAMUSCULAR | Status: DC | PRN

## 2018-04-18 MED ORDER — LACTATED RINGERS IV SOLN: 500.0000 mL | INTRAVENOUS | Status: DC | PRN

## 2018-04-18 MED ORDER — ONDANSETRON HCL 4 MG/2ML IJ SOLN: 4.0000 mg | INTRAMUSCULAR | Status: DC | PRN

## 2018-04-18 MED ORDER — DIPHENHYDRAMINE HCL 25 MG PO CAPS: 25.0000 mg | ORAL_CAPSULE | Freq: Four times a day (QID) | ORAL | Status: DC | PRN

## 2018-04-18 MED ORDER — SOD CITRATE-CITRIC ACID 500-334 MG/5ML PO SOLN: 30.0000 mL | ORAL | Status: DC | PRN

## 2018-04-18 MED ORDER — LIDOCAINE HCL (PF) 1 % IJ SOLN
30.0000 mL | INTRAMUSCULAR | Status: DC | PRN
  Filled 2018-04-18: qty 30

## 2018-04-18 MED ORDER — OXYTOCIN BOLUS FROM INFUSION
500.0000 mL | Freq: Once | INTRAVENOUS | Status: AC
  Administered 2018-04-18: 500 mL via INTRAVENOUS

## 2018-04-18 MED ORDER — FLEET ENEMA 7-19 GM/118ML RE ENEM: 1.0000 | ENEMA | RECTAL | Status: DC | PRN

## 2018-04-18 MED ORDER — SENNOSIDES-DOCUSATE SODIUM 8.6-50 MG PO TABS
2.0000 | ORAL_TABLET | ORAL | Status: DC
  Administered 2018-04-19: 2 via ORAL
  Filled 2018-04-18: qty 2

## 2018-04-18 MED ORDER — ZOLPIDEM TARTRATE 5 MG PO TABS: 5.0000 mg | ORAL_TABLET | Freq: Every evening | ORAL | Status: DC | PRN

## 2018-04-18 MED ORDER — COCONUT OIL OIL: 1.0000 "application " | TOPICAL_OIL | Status: DC | PRN

## 2018-04-18 MED ORDER — WITCH HAZEL-GLYCERIN EX PADS: 1.0000 "application " | MEDICATED_PAD | CUTANEOUS | Status: DC | PRN

## 2018-04-18 MED ORDER — PRENATAL MULTIVITAMIN CH
1.0000 | ORAL_TABLET | Freq: Every day | ORAL | Status: DC
  Administered 2018-04-18: 1 via ORAL
  Filled 2018-04-18: qty 1

## 2018-04-18 NOTE — MAU Note (Signed)
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 No lesions seen during cervical exam.

## 2018-04-18 NOTE — MAU Note (Signed)
Pt been contracting since 5am, 3cm last check.

## 2018-04-18 NOTE — Lactation Note (Signed)
This note was copied from a baby's chart. Lactation Consultation Note  Patient Name: Cynthia Mcdowell WVPXT'G Date: 04/18/2018 Reason for consult: Initial assessment;Term P2. 14 hour female infant. Per parents, this is mom's 4th time breastfeeding infant today, infant had 3 voids and 4 stools since delivery. Mom is confident with breastfeeding she breastfeed her eldest daughter for 21/2 years. Mom latched infant on right breast using the football hold, LC ask mom to wait until infant mouth is wide to latch. Mom hand express small amount of colostrum which stimulated infant to open her mouth, infant mouth was wide with deep latch- infant breastfeed for 7 minutes. Mom practiced hand expression by teaching back and infant was given 9 ml of colostrum by spoon.  Mom has good volume of colostrum. LC discussed I & O. Mom knows to breastfeed according to hunger cues, 8 or more times within 24 hours. Mom knows to call Nurse or LC if she has any questions, concerns or need assistance with latching infant to breast. Mom made aware of O/P services, breastfeeding support groups, community resources, and our phone # for post-discharge questions.   Maternal Data Formula Feeding for Exclusion: No Has patient been taught Hand Expression?: Yes(Infant was given 9 ml of colostrum by spoon.)  Feeding Feeding Type: Breast Fed  LATCH Score Latch: Grasps breast easily, tongue down, lips flanged, rhythmical sucking.  Audible Swallowing: Spontaneous and intermittent  Type of Nipple: Everted at rest and after stimulation  Comfort (Breast/Nipple): Soft / non-tender  Hold (Positioning): Assistance needed to correctly position infant at breast and maintain latch.  LATCH Score: 9  Interventions Interventions: Breast feeding basics reviewed;Assisted with latch;Skin to skin;Breast massage;Hand express;Support pillows;Adjust position;Breast compression;Hand pump  Lactation Tools Discussed/Used WIC Program:  No Pump Review: Setup, frequency, and cleaning;Milk Storage Initiated by:: Danelle Earthly, IBCLC Date initiated:: 04/11/18   Consult Status Consult Status: Follow-up Date: 04/19/18 Follow-up type: In-patient    Danelle Earthly 04/18/2018, 10:44 PM

## 2018-04-18 NOTE — H&P (Signed)
Cynthia Mcdowell is a 28 y.o. female, G3 P2012 at 39.2 weeks, presenting for active labor.  FM+.  IBOW.  Prenatal hx remarkable for HSV on suppression and chlamydia.  Asthma with no inhaler needed  Patient Active Problem List   Diagnosis Date Noted  . Normal labor and delivery 04/18/2018  . SVD (spontaneous vaginal delivery) 04/18/2018  . Leukopenia 03/30/2017  . Tubal ectopic pregnancy 03/30/2017  . Irregular periods/menstrual cycles   . Hx of chlamydia infection   . Asthma 09/19/2010  . Environmental allergies 09/19/2010  . Back pain 09/19/2010  . Eczema 09/19/2010  . Constipation 09/19/2010    History of present pregnancy: Patient entered care at 10 weeks.   EDC of 04/23/2018 was established by LMP.   Anatomy scan: 20  weeks, with normal findings and an posterior placenta.   Additional [place] evaluations: None  Significant prenatal events:  None Last evaluation: last week  OB History    Gravida  3   Para  1   Term  1   Preterm      AB      Living  1     SAB      TAB      Ectopic      Multiple      Live Births  1          Past Medical History:  Diagnosis Date  . Allergy    seasonal, food  . Asthma   . Chlamydia contact, treated    Age 68   . Constipation 08/2008  . H/O candidiasis   . H/O varicella   . Hx of chlamydia infection    2010  . Irregular periods/menstrual cycles 08/10/2011   Past Surgical History:  Procedure Laterality Date  . LAPAROSCOPY N/A 03/30/2017   Procedure: LAPAROSCOPY OPERATIVE WITH LEFT SALPINGECTOMY;  Surgeon: Tereso Newcomer, MD;  Location: [facility];  Service: Gynecology;  Laterality: N/A;  . NO PAST SURGERIES     Family History: family history includes Breast cancer in her maternal grandmother; Cancer in her maternal grandmother; Heart disease in her maternal uncle; Hyperlipidemia in her father; Hypertension in her mother; Kidney disease in her paternal grandmother. Social History:  reports that she has never smoked. She has  never used smokeless tobacco. She reports that she does not drink alcohol or use drugs.   Prenatal Transfer Tool  Maternal Diabetes: No Genetic Screening: Declined Maternal Ultrasounds/Referrals: Normal Fetal Ultrasounds or other Referrals:  None Maternal Substance Abuse:  No Significant Maternal Medications:  None Significant Maternal Lab Results: None  TDAP declined Flu declined  ROS: All ten systems reviewed and negative as stated above  Allergies  Allergen Reactions  . Peanut Oil Anaphylaxis  . 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 Shellfish Allergy Anaphylaxis  . Latex      Dilation: 10 Effacement (%): 90 Station: 0 Exam by:: Bernerd Pho CNM Blood pressure 107/61, pulse 79, temperature 98.5 F (36.9 C), temperature source Axillary, resp. rate 16, last menstrual period 07/21/2017, unknown if currently breastfeeding.  Chest clear Heart RRR without murmur Abd gravid, NT, FH appropriate Pelvic: adequate with no lesions noted Ext: Neg edema  FHR: Category 1 UCs:  Every 2  Prenatal labs: ABO, Rh: --/--/B POS (03/22 0865) Antibody: NEG (03/22 0824) Rubella:  Immune (08/09 0000) RPR:   NR HBsAg: Negative (08/09 0000)  HIV: Non-reactive (08/09 0000)  GBS: Negative (03/04 0000) Sickle cell/Hgb electrophoresis:  AA Pap:  2017 negative GC:  Neg Chlamydia:  Neg Genetic screenings:  Declined Glucola:   Other:   Hgb, 11.0 at 28 weeks       Assessment/Plan: IUP at 39.2 in active labor Cat 1 strip  Plan: Admit to Birthing Suite  Routine [facility] orders Plans on NCB Anticipate SVD  Henderson Newcomer ProtheroCNM, MSN 04/18/2018, 11:30 AM

## 2018-04-19 LAB — CBC
HCT: 31.8 % — ABNORMAL LOW (ref 36.0–46.0)
HEMOGLOBIN: 10.2 g/dL — AB (ref 12.0–15.0)
MCH: 28.6 pg (ref 26.0–34.0)
MCHC: 32.1 g/dL (ref 30.0–36.0)
MCV: 89.1 fL (ref 80.0–100.0)
Platelets: 139 10*3/uL — ABNORMAL LOW (ref 150–400)
RBC: 3.57 MIL/uL — ABNORMAL LOW (ref 3.87–5.11)
RDW: 12.7 % (ref 11.5–15.5)
WBC: 6.2 10*3/uL (ref 4.0–10.5)
nRBC: 0 % (ref 0.0–0.2)

## 2018-04-19 MED ORDER — IBUPROFEN 600 MG PO TABS: 600.0000 mg | ORAL_TABLET | Freq: Four times a day (QID) | ORAL | 0 refills | Status: DC

## 2018-04-19 NOTE — Discharge Summary (Signed)
SVD OB Discharge Summary     Patient Name: Cynthia Mcdowell DOB: 1990-11-05 MRN: [id number]  Date of admission: 04/18/2018 Delivering MD: Kenney Houseman  Date of delivery: 04/18/2018 Type of delivery: SVD  Newborn Data: Sex: Baby female Live born female  Birth Weight: 7 lb 12 oz (3515 g) APGAR: 9, 9  Newborn Delivery   Birth date/time:  04/18/2018 08:04:00 Delivery type:  Vaginal, Spontaneous     Feeding: breast Infant being discharge to home with mother in stable condition.   Admitting diagnosis: ctx 3-47mins Intrauterine pregnancy: [redacted]w[redacted]d     Secondary diagnosis:  Active Problems:   Normal labor and delivery   SVD (spontaneous vaginal delivery)   Normal postpartum course                                Complications: None                                                              Intrapartum Procedures: spontaneous vaginal delivery Postpartum Procedures: none Complications-Operative and Postpartum: none Augmentation: AROM   History of Present Illness: Cynthia Mcdowell is a 28 y.o. female, G3P2002, who presents at [redacted]w[redacted]d weeks gestation. The patient has been followed at  [facility] and Gynecology  Her pregnancy has been complicated by:  Patient Active Problem List   Diagnosis Date Noted  . Normal postpartum course 04/19/2018  . Normal labor and delivery 04/18/2018  . SVD (spontaneous vaginal delivery) 04/18/2018  . Leukopenia 03/30/2017  . Tubal ectopic pregnancy 03/30/2017  . Irregular periods/menstrual cycles   . Hx of chlamydia infection   . Asthma 09/19/2010  . Environmental allergies 09/19/2010  . Back pain 09/19/2010  . Eczema 09/19/2010  . Constipation 09/19/2010   Hospital course:  Onset of Labor With Vaginal Delivery     28 y.o. yo G3P2002 at [redacted]w[redacted]d was admitted in Active Labor on 04/18/2018. Patient had an uncomplicated labor course as follows:  Membrane Rupture Time/Date: 7:55 AM ,04/18/2018   Intrapartum Procedures:  Episiotomy: None [1]                                         Lacerations:  None [1]  Patient had a delivery of a Viable infant. 04/18/2018  Information for the patient's newborn:  Keron, Erkkila [[id number]]  Delivery Method: Vag-Spont    Pateint had an uncomplicated postpartum course.  She is ambulating, tolerating a regular diet, passing flatus, and urinating well. Patient is discharged home in stable condition on 04/19/18.  Postpartum Day # 1 : S/P NSVD due to spontaneous labor. Patient up ad lib, denies syncope or dizziness. Reports consuming regular diet without issues and denies N/V. Patient reports 0 bowel movement + passing flatus.  Denies issues with urination and reports bleeding is "lighter."  Patient is breastfeeding and reports going well.  Desires unknown but education done for postpartum contraception.  Pain is being appropriately managed with use of po meds. Post delivery hgb of 10.2 down from 11.4, pt stable.    Physical exam  Vitals:   04/18/18 1600 04/18/18 2053 04/19/18 0026  04/19/18 0515  BP: 111/67 108/73 112/69 110/67  Pulse: 82 75 94 80  Resp: 16 16 16 16   Temp: (!) 97.5 F (36.4 C) 98.3 F (36.8 C) 97.8 F (36.6 C) 97.9 F (36.6 C)  TempSrc: Oral Axillary Axillary Oral  SpO2: 100% 100% 100% 100%   General: alert, cooperative and no distress Lochia: appropriate Uterine Fundus: firm Perineum: Intact, no hematomas noted DVT Evaluation: No evidence of DVT seen on physical exam. Negative Homan's sign. No cords or calf tenderness. No significant calf/ankle edema.  Labs: Lab Results  Component Value Date   WBC 6.2 04/19/2018   HGB 10.2 (L) 04/19/2018   HCT 31.8 (L) 04/19/2018   MCV 89.1 04/19/2018   PLT 139 (L) 04/19/2018   CMP Latest Ref Rng & Units 06/09/2017  Glucose 70 - 140 mg/dL 83  BUN 7 - 26 mg/dL 10  Creatinine 8.24 - 2.35 mg/dL 3.61  Sodium 443 - 154 mmol/L 139  Potassium 3.5 - 5.1 mmol/L 4.0  Chloride 98 - 109 mmol/L 105  CO2 22 - 29  mmol/L 27  Calcium 8.4 - 10.4 mg/dL 9.5  Total Protein 6.4 - 8.3 g/dL 7.9  Total Bilirubin 0.2 - 1.2 mg/dL 0.4  Alkaline Phos 40 - 150 U/L 107  AST 5 - 34 U/L 17  ALT 0 - 55 U/L 13    Date of discharge: 04/19/2018 Discharge Diagnoses: Term Pregnancy-delivered Discharge instruction: per After Visit Summary and "Baby and Me Booklet".  After visit meds:  Allergies as of 04/19/2018      Reactions   Peanut Oil Anaphylaxis   Peanut-containing Drug Products Anaphylaxis   Allergic to all nuts   Shellfish Allergy Anaphylaxis   Latex Itching      Medication List    TAKE these medications   folic acid 1 MG tablet Commonly known as:  FOLVITE Take 1 mg by mouth daily.   ibuprofen 600 MG tablet Commonly known as:  ADVIL,MOTRIN Take 1 tablet (600 mg total) by mouth every 6 (six) hours.       Activity:           unrestricted and pelvic rest Advance as tolerated. Pelvic rest for 6 weeks.  Diet:                routine Medications: PNV and Ibuprofen Postpartum contraception: Undecided Condition:  Pt discharge to home with baby in stable and condition  Meds: Allergies as of 04/19/2018      Reactions   Peanut Oil Anaphylaxis   Peanut-containing Drug Products Anaphylaxis   Allergic to all nuts   Shellfish Allergy Anaphylaxis   Latex Itching      Medication List    TAKE these medications   folic acid 1 MG tablet Commonly known as:  FOLVITE Take 1 mg by mouth daily.   ibuprofen 600 MG tablet Commonly known as:  ADVIL,MOTRIN Take 1 tablet (600 mg total) by mouth every 6 (six) hours.       Discharge Follow Up:  Follow-up Information    [facility] Follow up in 6 week(s).   Specialty:  Obstetrics and Gynecology Contact information: [address] [place] [address] [phone]           [place], NP-C, CNM 04/19/2018, 9:23 AM  Dale [place], FNP

## 2018-10-24 ENCOUNTER — Other Ambulatory Visit: Payer: Self-pay

## 2018-10-24 ENCOUNTER — Encounter (HOSPITAL_COMMUNITY): Payer: Self-pay

## 2018-10-24 ENCOUNTER — Inpatient Hospital Stay (HOSPITAL_COMMUNITY)
Admission: AD | Admit: 2018-10-24 | Discharge: 2018-10-25 | Disposition: A | Discharge status: Home / Self Care | Payer: Medicaid Other | Attending: Obstetrics and Gynecology | Admitting: Obstetrics and Gynecology

## 2018-10-24 DIAGNOSIS — Z3201 Encounter for pregnancy test, result positive: Secondary | ICD-10-CM

## 2018-10-24 DIAGNOSIS — Z3491 Encounter for supervision of normal pregnancy, unspecified, first trimester: Secondary | ICD-10-CM | POA: Insufficient documentation

## 2018-10-24 LAB — POC URINE PREG, ED: Preg Test, Ur: POSITIVE — AB

## 2018-10-24 NOTE — MAU Note (Signed)
Here for pregnancy confirmation for her job.  No VB or pain.

## 2018-10-25 NOTE — MAU Provider Note (Signed)
First Provider Initiated Contact with Patient 10/25/18 0006      S Ms. Cynthia Mcdowell is a 28 y.o. [id number] pregnant female who presents, from [facility], to MAU today with complaint of positive home UPT. 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    O BP 115/68   Pulse 81   Temp 98.4 F (36.9 C)   Resp 17   LMP 09/23/2018   SpO2 100%  Physical Exam  Constitutional: She is oriented to person, place, and time. She appears well-developed and well-nourished. No distress.  HENT:  Head: Normocephalic and atraumatic.  Eyes: Conjunctivae are normal.  Neck: Normal range of motion.  Cardiovascular: Normal rate.  Respiratory: Effort normal.  Musculoskeletal: Normal range of motion.  Neurological: She is alert and oriented to person, place, and time.  Skin: Skin is warm and dry.  Psychiatric: She has a normal mood and affect. Her behavior is normal.    A Pregnant female  Medical screening exam complete   P Patient informed that we do not usually provide pregnancy verification letters. However UPT performed and positive in [facility]. Pregnancy verification letter given. No questions or concerns. Patient reports she will receive care at [facility]. Discharge from MAU in stable condition Patient may return to MAU as needed for pregnancy related complaints  Gavin Pound, CNM 10/25/2018 12:06 AM

## 2018-11-16 ENCOUNTER — Other Ambulatory Visit (HOSPITAL_COMMUNITY): Payer: Self-pay | Admitting: Obstetrics and Gynecology

## 2018-11-26 ENCOUNTER — Other Ambulatory Visit: Payer: Self-pay

## 2018-11-26 DIAGNOSIS — Z20822 Contact with and (suspected) exposure to covid-19: Secondary | ICD-10-CM

## 2018-11-27 LAB — NOVEL CORONAVIRUS, NAA: SARS-CoV-2, NAA: NOT DETECTED

## 2018-12-20 ENCOUNTER — Other Ambulatory Visit (HOSPITAL_COMMUNITY): Payer: Self-pay | Admitting: Obstetrics and Gynecology

## 2019-01-26 ENCOUNTER — Other Ambulatory Visit (HOSPITAL_COMMUNITY): Payer: Self-pay | Admitting: Obstetrics & Gynecology

## 2019-01-26 DIAGNOSIS — Z3A19 19 weeks gestation of pregnancy: Secondary | ICD-10-CM

## 2019-01-26 DIAGNOSIS — Z363 Encounter for antenatal screening for malformations: Secondary | ICD-10-CM

## 2019-01-28 NOTE — L&D Delivery Note (Addendum)
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 I was called to the room when she progressed 2+ station in the second stage of labor, AROM, clear.  She pushed for 1/min.  She delivered a viable infant, cephalic and restituted to the OA position over an intact perineum.  A nuchal cord   was not identified. The baby was placed on maternal abdomen while initial step of NRP were perfmored (Dry, Stimulated, and warmed). Hat placed on baby for thermoregulation. Delayed cord clamping was performed for 2 minutes.  Cord double clamped and cut.  Cord cut by father. Apgar scores were 8 and 7. Infant started to lose a little color, sneaking/coughing repetitively, infant was brought to radiant warmer for further assessment by Peds MD, en route to room now. Prophylactic Pitocin was started in the third stage of labor for active management. The placenta delivered spontaneously, shultz, with a 3 vessel cord and was sent to pathology for preterm labor and delivery.  Inspection revealed none. An examination of the vaginal vault and cervix was free from lacerations. The uterus was firm, bleeding stable. Umbilical artery blood gas were not sent.  There were no complications during the procedure.  Peds assess and cleared the newborn and placed newborn back to skin to skin on mother. Mom and baby skin to skin following delivery. Left in stable condition.  Maternal Info: Anesthesia: Natural Episiotomy:  No Lacerations:  No Suture Repair: No Est. Blood Loss (mL):   Newborn Info:  Baby Sex: female  APGAR (1 MIN): 8   APGAR (5 MINS): 7   APGAR (10 MINS):     Mom to postpartum.  Baby to Couplet care / Skin to Skin.  DR Richardson Dopp was updated on birth   [place], [place], NP-C 06/04/19 6:08 AM

## 2019-02-03 ENCOUNTER — Other Ambulatory Visit (HOSPITAL_COMMUNITY): Payer: Self-pay | Admitting: Obstetrics & Gynecology

## 2019-02-03 ENCOUNTER — Ambulatory Visit (HOSPITAL_COMMUNITY)
Admission: RE | Admit: 2019-02-03 | Discharge: 2019-02-03 | Disposition: A | Discharge status: Home / Self Care | Payer: Medicaid Other | Source: Ambulatory Visit | Attending: Obstetrics and Gynecology | Admitting: Obstetrics and Gynecology

## 2019-02-03 ENCOUNTER — Other Ambulatory Visit: Payer: Self-pay

## 2019-02-03 DIAGNOSIS — O359XX0 Maternal care for (suspected) fetal abnormality and damage, unspecified, not applicable or unspecified: Secondary | ICD-10-CM | POA: Diagnosis not present

## 2019-02-03 DIAGNOSIS — Z363 Encounter for antenatal screening for malformations: Secondary | ICD-10-CM

## 2019-02-03 DIAGNOSIS — Z3A19 19 weeks gestation of pregnancy: Secondary | ICD-10-CM | POA: Diagnosis present

## 2019-04-16 ENCOUNTER — Inpatient Hospital Stay (HOSPITAL_COMMUNITY)
Admission: AD | Admit: 2019-04-16 | Discharge: 2019-04-16 | Disposition: A | Discharge status: Home / Self Care | Payer: Medicaid Other | Attending: Obstetrics & Gynecology | Admitting: Obstetrics & Gynecology

## 2019-04-16 ENCOUNTER — Encounter (HOSPITAL_COMMUNITY): Payer: Self-pay | Admitting: Obstetrics & Gynecology

## 2019-04-16 ENCOUNTER — Other Ambulatory Visit: Payer: Self-pay

## 2019-04-16 ENCOUNTER — Inpatient Hospital Stay (HOSPITAL_BASED_OUTPATIENT_CLINIC_OR_DEPARTMENT_OTHER): Payer: Medicaid Other

## 2019-04-16 DIAGNOSIS — O359XX0 Maternal care for (suspected) fetal abnormality and damage, unspecified, not applicable or unspecified: Secondary | ICD-10-CM

## 2019-04-16 DIAGNOSIS — R1030 Lower abdominal pain, unspecified: Secondary | ICD-10-CM | POA: Insufficient documentation

## 2019-04-16 DIAGNOSIS — O26893 Other specified pregnancy related conditions, third trimester: Secondary | ICD-10-CM | POA: Insufficient documentation

## 2019-04-16 DIAGNOSIS — O26899 Other specified pregnancy related conditions, unspecified trimester: Secondary | ICD-10-CM | POA: Diagnosis not present

## 2019-04-16 DIAGNOSIS — Z3A29 29 weeks gestation of pregnancy: Secondary | ICD-10-CM | POA: Insufficient documentation

## 2019-04-16 DIAGNOSIS — R109 Unspecified abdominal pain: Secondary | ICD-10-CM

## 2019-04-16 LAB — URINALYSIS, ROUTINE W REFLEX MICROSCOPIC
Bilirubin Urine: NEGATIVE
Glucose, UA: NEGATIVE mg/dL
Hgb urine dipstick: NEGATIVE
Ketones, ur: NEGATIVE mg/dL
Nitrite: NEGATIVE
Protein, ur: NEGATIVE mg/dL
Specific Gravity, Urine: 1.008 (ref 1.005–1.030)
pH: 7 (ref 5.0–8.0)

## 2019-04-16 LAB — COMPREHENSIVE METABOLIC PANEL
ALT: 11 U/L (ref 0–44)
AST: 22 U/L (ref 15–41)
Albumin: 3 g/dL — ABNORMAL LOW (ref 3.5–5.0)
Alkaline Phosphatase: 138 U/L — ABNORMAL HIGH (ref 38–126)
Anion gap: 10 (ref 5–15)
BUN: <5 mg/dL — ABNORMAL LOW (ref 6–20)
CO2: 24 mmol/L (ref 22–32)
Calcium: 8.5 mg/dL — ABNORMAL LOW (ref 8.9–10.3)
Chloride: 103 mmol/L (ref 98–111)
Creatinine, Ser: 0.54 mg/dL (ref 0.44–1.00)
GFR calc Af Amer: >60 mL/min (ref >60)
GFR calc non Af Amer: >60 mL/min (ref >60)
Glucose, Bld: 84 mg/dL (ref 70–99)
Potassium: 3.7 mmol/L (ref 3.5–5.1)
Sodium: 137 mmol/L (ref 135–145)
Total Bilirubin: 0.9 mg/dL (ref 0.3–1.2)
Total Protein: 6.9 g/dL (ref 6.5–8.1)

## 2019-04-16 LAB — CBC WITH DIFFERENTIAL/PLATELET
Abs Immature Granulocytes: 0.02 10*3/uL (ref 0.00–0.07)
Basophils Absolute: 0 10*3/uL (ref 0.0–0.1)
Basophils Relative: 0 %
Eosinophils Absolute: 0.1 10*3/uL (ref 0.0–0.5)
Eosinophils Relative: 1 %
HCT: 34.2 % — ABNORMAL LOW (ref 36.0–46.0)
Hemoglobin: 11.5 g/dL — ABNORMAL LOW (ref 12.0–15.0)
Immature Granulocytes: 0 %
Lymphocytes Relative: 6 %
Lymphs Abs: 0.3 10*3/uL — ABNORMAL LOW (ref 0.7–4.0)
MCH: 30.4 pg (ref 26.0–34.0)
MCHC: 33.6 g/dL (ref 30.0–36.0)
MCV: 90.5 fL (ref 80.0–100.0)
Monocytes Absolute: 0.3 10*3/uL (ref 0.1–1.0)
Monocytes Relative: 5 %
Neutro Abs: 5.2 10*3/uL (ref 1.7–7.7)
Neutrophils Relative %: 88 %
Platelets: 169 10*3/uL (ref 150–400)
RBC: 3.78 MIL/uL — ABNORMAL LOW (ref 3.87–5.11)
RDW: 11.9 % (ref 11.5–15.5)
WBC: 6 10*3/uL (ref 4.0–10.5)
nRBC: 0 % (ref 0.0–0.2)

## 2019-04-16 LAB — FETAL FIBRONECTIN: Fetal Fibronectin: NEGATIVE

## 2019-04-16 LAB — WET PREP, GENITAL
Sperm: NONE SEEN
Trich, Wet Prep: NONE SEEN
Yeast Wet Prep HPF POC: NONE SEEN

## 2019-04-16 MED ORDER — LACTATED RINGERS IV BOLUS
1000.0000 mL | Freq: Once | INTRAVENOUS | Status: AC
  Administered 2019-04-16: 08:00:00 1000 mL via INTRAVENOUS

## 2019-04-16 MED ORDER — CYCLOBENZAPRINE HCL 5 MG PO TABS
10.0000 mg | ORAL_TABLET | Freq: Once | ORAL | Status: AC
  Administered 2019-04-16: 10 mg via ORAL
  Filled 2019-04-16 (×2): qty 2

## 2019-04-16 MED ORDER — ACETAMINOPHEN 500 MG PO TABS
1000.0000 mg | ORAL_TABLET | Freq: Once | ORAL | Status: AC
  Administered 2019-04-16: 1000 mg via ORAL
  Filled 2019-04-16: qty 2

## 2019-04-16 NOTE — MAU Note (Signed)
Pt states that she is here for abdominal pain that started at 12 midnight. Pt states the pain is constant. Pt denies any vaginal bleeding or loss of fluid.

## 2019-04-16 NOTE — MAU Provider Note (Addendum)
History     CSN: [id number]  Arrival date and time: 04/16/19 9924   First Provider Initiated Contact with Patient 04/16/19 [phone]      Chief Complaint  Patient presents with  . Abdominal Pain   Ms. Cynthia Mcdowell is a 29 y.o. [phone] at [redacted]w[redacted]d who presents to MAU for abdominal pain. Pt reports she was previously experiencing nausea and vomiting, but not at this time. Pt reports last intercourse "a while ago" and denies anything in the vagina in the past 24hrs.  Onset: 12 midnight Location: entire abdomen Duration: <24hrs Character: intermittent, "discomfort, feels like mild labor" Aggravating/Associated: none/none Relieving: none Treatment: none Severity: 0/10  Pt denies VB, LOF, ctx, decreased FM, vaginal discharge/odor/itching. Pt denies N/V, constipation, diarrhea, or urinary problems. Pt denies fever, chills, fatigue, sweating or changes in appetite. Pt denies SOB or chest pain. Pt denies dizziness, HA, light-headedness, weakness.  Problems this pregnancy include: none. Allergies? peanuts, shellfish, latex Current medications/supplements? None (pt confirms no PNV) Prenatal care provider? [facility], next appt 04/19/2019   OB History    Gravida  4   Para  2   Term  2   Preterm      AB      Living  2     SAB      TAB      Ectopic      Multiple  0   Live Births  2           Past Medical History:  Diagnosis Date  . Allergy    seasonal, food  . Asthma   . Chlamydia contact, treated    Age 43   . Constipation 08/2008  . H/O candidiasis   . H/O varicella   . Hx of chlamydia infection    2010  . Irregular periods/menstrual cycles 08/10/2011    Past Surgical History:  Procedure Laterality Date  . LAPAROSCOPY N/A 03/30/2017   Procedure: LAPAROSCOPY OPERATIVE WITH LEFT SALPINGECTOMY;  Surgeon: Osborne Oman, MD;  Location: [facility];  Service: Gynecology;  Laterality: N/A;  . NO PAST SURGERIES      Family History  Problem Relation Age of Onset   . Breast cancer Maternal Grandmother   . Cancer Maternal Grandmother   . Hypertension Mother   . Hyperlipidemia Father   . Heart disease Maternal Uncle   . Kidney disease Paternal Grandmother   . Anesthesia problems Neg Hx   . Hypotension Neg Hx   . Malignant hyperthermia Neg Hx   . Pseudochol deficiency Neg Hx     Social History   Tobacco Use  . Smoking status: Never Smoker  . Smokeless tobacco: Never Used  Substance Use Topics  . Alcohol use: No  . Drug use: No    Allergies:  Allergies  Allergen Reactions  . Peanut Oil Anaphylaxis  . 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 Shellfish Allergy Anaphylaxis  . Latex Itching    Medications Prior to Admission  Medication Sig Dispense Refill Last Dose  . folic acid (FOLVITE) 1 MG tablet Take 1 mg by mouth daily.   More than a month at Unknown time  . ibuprofen (ADVIL,MOTRIN) 600 MG tablet Take 1 tablet (600 mg total) by mouth every 6 (six) hours. 30 tablet 0 More than a month at Unknown time    Review of Systems  Constitutional: Negative for chills, diaphoresis, fatigue and fever.  Eyes: Negative for visual disturbance.  Respiratory: Negative for shortness  of breath.   Cardiovascular: Negative for chest pain.  Gastrointestinal: Positive for abdominal pain. Negative for constipation, diarrhea, nausea and vomiting.  Genitourinary: Negative for dysuria, flank pain, frequency, pelvic pain, urgency, vaginal bleeding and vaginal discharge.  Neurological: Negative for dizziness, weakness, light-headedness and headaches.   Physical Exam   Blood pressure 104/65, pulse 95, temperature 98.4 F (36.9 C), temperature source Oral, resp. rate 16, height 5\' 3"  (1.6 m), weight 75.8 kg, last menstrual period 09/23/2018, unknown if currently breastfeeding.  Patient Vitals for the past 24 hrs:  BP Temp Temp src Pulse Resp Height Weight  04/16/19 0553 104/65 98.4 F (36.9 C) Oral 95 16 5\' 3"  (1.6 m) 75.8  kg   Physical Exam  Constitutional: She is oriented to person, place, and time. She appears well-developed and well-nourished. No distress.  HENT:  Head: Normocephalic and atraumatic.  Respiratory: Effort normal.  GI: Soft. She exhibits no distension and no mass. There is abdominal tenderness in the right lower quadrant and left lower quadrant. There is no rebound and no guarding.  Genitourinary: There is no rash, tenderness or lesion on the right labia. There is no rash, tenderness or lesion on the left labia.    No vaginal discharge.     Genitourinary Comments: CE: midline/soft/long/1   Neurological: She is alert and oriented to person, place, and time.  Skin: Skin is warm and dry. She is not diaphoretic.  Psychiatric: She has a normal mood and affect. Her behavior is normal. Judgment and thought content normal.   Results for orders placed or performed during the hospital encounter of 04/16/19 (from the past 24 hour(s))  Urinalysis, Routine w reflex microscopic     Status: Abnormal   Collection Time: 04/16/19  5:25 AM  Result Value Ref Range   Color, Urine YELLOW YELLOW   APPearance HAZY (A) CLEAR   Specific Gravity, Urine 1.008 1.005 - 1.030   pH 7.0 5.0 - 8.0   Glucose, UA NEGATIVE NEGATIVE mg/dL   Hgb urine dipstick NEGATIVE NEGATIVE   Bilirubin Urine NEGATIVE NEGATIVE   Ketones, ur NEGATIVE NEGATIVE mg/dL   Protein, ur NEGATIVE NEGATIVE mg/dL   Nitrite NEGATIVE NEGATIVE   Leukocytes,Ua MODERATE (A) NEGATIVE   RBC / HPF 0-5 0 - 5 RBC/hpf   WBC, UA 11-20 0 - 5 WBC/hpf   Bacteria, UA RARE (A) NONE SEEN   Squamous Epithelial / LPF 21-50 0 - 5  Wet prep, genital     Status: Abnormal   Collection Time: 04/16/19  7:28 AM  Result Value Ref Range   Yeast Wet Prep HPF POC NONE SEEN NONE SEEN   Trich, Wet Prep NONE SEEN NONE SEEN   Clue Cells Wet Prep HPF POC PRESENT (A) NONE SEEN   WBC, Wet Prep HPF POC MANY (A) NONE SEEN   Sperm NONE SEEN    No results found.    MAU Course   Procedures  MDM -abdominal discomfort with N/V earlier in course, but not present at this time -initial CE @0730AM : midline/soft/long/1 -orders: UA, CBC w/ Diff, CMP, fFN, WetPrep, GC/CT, limited 04/18/19 -meds: fluid bolus LR, Tylenol, Flexeril -care transferred to K. 04/18/19, CNM @800AM   Orders Placed This Encounter  Procedures  . Culture, OB Urine    Standing Status:   Standing    Number of Occurrences:   1  . Wet prep, genital    Standing Status:   Standing    Number of Occurrences:   1  . MFM OB  LIMITED    Standing Status:   Standing    Number of Occurrences:   1    Order Specific Question:   Symptom/Reason for Exam    Answer:   Abdominal pain in pregnancy [[id number]]  . Urinalysis, Routine w reflex microscopic    Standing Status:   Standing    Number of Occurrences:   1  . Fetal fibronectin    Standing Status:   Standing    Number of Occurrences:   1  . CBC with Differential/Platelet    Standing Status:   Standing    Number of Occurrences:   1  . Comprehensive metabolic panel    Standing Status:   Standing    Number of Occurrences:   1  . Insert peripheral IV    Standing Status:   Standing    Number of Occurrences:   1   Meds ordered this encounter  Medications  . lactated ringers bolus 1,000 mL  . acetaminophen (TYLENOL) tablet 1,000 mg  . cyclobenzaprine (FLEXERIL) tablet 10 mg    Assessment and Plan   1. Lower abdominal pain   2. Abdominal pain in pregnancy    2. Patient's lab results are normal, no signs of infection. She had one episode of diarrhea while in MAU, but otherwise has rested, fluid bolus and eaten soda ad crackers. Desires discharge.   3. NST: 150 bpm, mod present acel, neg decels, uterine irratability.   4. Discharge home with labor precuations and plan to keep follow up visit at [facility] on 04/19/2019. All questions answered.

## 2019-04-16 NOTE — Discharge Instructions (Signed)

## 2019-04-18 LAB — CULTURE, OB URINE: Culture: 80000 — AB

## 2019-04-18 LAB — GC/CHLAMYDIA PROBE AMP (~~LOC~~) NOT AT ARMC
Chlamydia: NEGATIVE
Comment: NEGATIVE
Comment: NORMAL
Neisseria Gonorrhea: NEGATIVE

## 2019-06-04 ENCOUNTER — Encounter (HOSPITAL_COMMUNITY): Payer: Self-pay | Admitting: Obstetrics and Gynecology

## 2019-06-04 ENCOUNTER — Encounter: Payer: Self-pay | Admitting: Student

## 2019-06-04 ENCOUNTER — Other Ambulatory Visit: Payer: Self-pay

## 2019-06-04 ENCOUNTER — Inpatient Hospital Stay (HOSPITAL_COMMUNITY)
Admission: AD | Admit: 2019-06-04 | Discharge: 2019-06-05 | DRG: 807 | Disposition: A | Discharge status: Home / Self Care | Payer: Medicaid Other | Attending: Obstetrics and Gynecology | Admitting: Obstetrics and Gynecology

## 2019-06-04 DIAGNOSIS — Z20822 Contact with and (suspected) exposure to covid-19: Secondary | ICD-10-CM | POA: Diagnosis present

## 2019-06-04 DIAGNOSIS — Z3A36 36 weeks gestation of pregnancy: Secondary | ICD-10-CM | POA: Diagnosis not present

## 2019-06-04 DIAGNOSIS — Z91040 Latex allergy status: Secondary | ICD-10-CM

## 2019-06-04 LAB — CBC
HCT: 35 % — ABNORMAL LOW (ref 36.0–46.0)
Hemoglobin: 11.3 g/dL — ABNORMAL LOW (ref 12.0–15.0)
MCH: 29.1 pg (ref 26.0–34.0)
MCHC: 32.3 g/dL (ref 30.0–36.0)
MCV: 90.2 fL (ref 80.0–100.0)
Platelets: 165 10*3/uL (ref 150–400)
RBC: 3.88 MIL/uL (ref 3.87–5.11)
RDW: 12.8 % (ref 11.5–15.5)
WBC: 4.4 10*3/uL (ref 4.0–10.5)
nRBC: 0 % (ref 0.0–0.2)

## 2019-06-04 LAB — TYPE AND SCREEN
ABO/RH(D): B POS
Antibody Screen: NEGATIVE

## 2019-06-04 LAB — RPR: RPR Ser Ql: NONREACTIVE

## 2019-06-04 LAB — RESPIRATORY PANEL BY RT PCR (FLU A&B, COVID)
Influenza A by PCR: NEGATIVE
Influenza B by PCR: NEGATIVE
SARS Coronavirus 2 by RT PCR: NEGATIVE

## 2019-06-04 MED ORDER — PRENATAL MULTIVITAMIN CH
1.0000 | ORAL_TABLET | Freq: Every day | ORAL | Status: DC
  Administered 2019-06-04 – 2019-06-05 (×2): 1 via ORAL
  Filled 2019-06-04 (×2): qty 1

## 2019-06-04 MED ORDER — WITCH HAZEL-GLYCERIN EX PADS: 1.0000 "application " | MEDICATED_PAD | CUTANEOUS | Status: DC | PRN

## 2019-06-04 MED ORDER — IBUPROFEN 600 MG PO TABS
600.0000 mg | ORAL_TABLET | Freq: Four times a day (QID) | ORAL | Status: DC
  Administered 2019-06-04 – 2019-06-05 (×5): 600 mg via ORAL
  Filled 2019-06-04 (×5): qty 1

## 2019-06-04 MED ORDER — BENZOCAINE-MENTHOL 20-0.5 % EX AERO: 1.0000 "application " | INHALATION_SPRAY | CUTANEOUS | Status: DC | PRN

## 2019-06-04 MED ORDER — OXYTOCIN 40 UNITS IN NORMAL SALINE INFUSION - SIMPLE MED
INTRAVENOUS | Status: AC
  Filled 2019-06-04: qty 1000

## 2019-06-04 MED ORDER — LACTATED RINGERS IV SOLN: 500.0000 mL | INTRAVENOUS | Status: DC | PRN

## 2019-06-04 MED ORDER — ACETAMINOPHEN 325 MG PO TABS
650.0000 mg | ORAL_TABLET | ORAL | Status: DC | PRN
  Administered 2019-06-04 (×2): 650 mg via ORAL
  Filled 2019-06-04 (×2): qty 2

## 2019-06-04 MED ORDER — ONDANSETRON HCL 4 MG/2ML IJ SOLN: 4.0000 mg | INTRAMUSCULAR | Status: DC | PRN

## 2019-06-04 MED ORDER — LACTATED RINGERS IV SOLN: INTRAVENOUS | Status: DC

## 2019-06-04 MED ORDER — DIPHENHYDRAMINE HCL 25 MG PO CAPS: 25.0000 mg | ORAL_CAPSULE | Freq: Four times a day (QID) | ORAL | Status: DC | PRN

## 2019-06-04 MED ORDER — TETANUS-DIPHTH-ACELL PERTUSSIS 5-2.5-18.5 LF-MCG/0.5 IM SUSP: 0.5000 mL | Freq: Once | INTRAMUSCULAR | Status: DC

## 2019-06-04 MED ORDER — ONDANSETRON HCL 4 MG PO TABS: 4.0000 mg | ORAL_TABLET | ORAL | Status: DC | PRN

## 2019-06-04 MED ORDER — OXYCODONE-ACETAMINOPHEN 5-325 MG PO TABS: 1.0000 | ORAL_TABLET | ORAL | Status: DC | PRN

## 2019-06-04 MED ORDER — ZOLPIDEM TARTRATE 5 MG PO TABS: 5.0000 mg | ORAL_TABLET | Freq: Every evening | ORAL | Status: DC | PRN

## 2019-06-04 MED ORDER — FLEET ENEMA 7-19 GM/118ML RE ENEM: 1.0000 | ENEMA | RECTAL | Status: DC | PRN

## 2019-06-04 MED ORDER — OXYTOCIN BOLUS FROM INFUSION
500.0000 mL | Freq: Once | INTRAVENOUS | Status: AC
  Administered 2019-06-04: 500 mL via INTRAVENOUS

## 2019-06-04 MED ORDER — SIMETHICONE 80 MG PO CHEW: 80.0000 mg | CHEWABLE_TABLET | ORAL | Status: DC | PRN

## 2019-06-04 MED ORDER — OXYCODONE-ACETAMINOPHEN 5-325 MG PO TABS: 2.0000 | ORAL_TABLET | ORAL | Status: DC | PRN

## 2019-06-04 MED ORDER — ACETAMINOPHEN 325 MG PO TABS: 650.0000 mg | ORAL_TABLET | ORAL | Status: DC | PRN

## 2019-06-04 MED ORDER — DIBUCAINE (PERIANAL) 1 % EX OINT: 1.0000 "application " | TOPICAL_OINTMENT | CUTANEOUS | Status: DC | PRN

## 2019-06-04 MED ORDER — ONDANSETRON HCL 4 MG/2ML IJ SOLN: 4.0000 mg | Freq: Four times a day (QID) | INTRAMUSCULAR | Status: DC | PRN

## 2019-06-04 MED ORDER — OXYTOCIN 40 UNITS IN NORMAL SALINE INFUSION - SIMPLE MED: 2.5000 [IU]/h | INTRAVENOUS | Status: DC

## 2019-06-04 MED ORDER — SENNOSIDES-DOCUSATE SODIUM 8.6-50 MG PO TABS
2.0000 | ORAL_TABLET | ORAL | Status: DC
  Administered 2019-06-04: 2 via ORAL
  Filled 2019-06-04: qty 2

## 2019-06-04 MED ORDER — LIDOCAINE HCL (PF) 1 % IJ SOLN: 30.0000 mL | INTRAMUSCULAR | Status: DC | PRN

## 2019-06-04 MED ORDER — SOD CITRATE-CITRIC ACID 500-334 MG/5ML PO SOLN: 30.0000 mL | ORAL | Status: DC | PRN

## 2019-06-04 MED ORDER — COCONUT OIL OIL: 1.0000 "application " | TOPICAL_OIL | Status: DC | PRN

## 2019-06-04 NOTE — H&P (Signed)
Cynthia Mcdowell is a 29 y.o. 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 Allergy to latex, peanut, and shellfish. Fetal echogenicities found on [place] on 1/14 (ON MFM [place] 02/03/19, NORMAL PANORAMA, PATIENT DECLINED AMNIOCENTESIS.) Pt endorse + Fm. Denies vaginal leakage. Denies vaginal bleeding.   Patient Active Problem List   Diagnosis Date Noted  . Indication for care in labor or delivery 06/04/2019  . Normal postpartum course 04/19/2018  . Normal labor and delivery 04/18/2018  . SVD (spontaneous vaginal delivery) 04/18/2018  . Leukopenia 03/30/2017  . Tubal ectopic pregnancy 03/30/2017  . Irregular periods/menstrual cycles   . Hx of chlamydia infection   . Asthma 09/19/2010  . Environmental allergies 09/19/2010  . Back pain 09/19/2010  . Eczema 09/19/2010  . Constipation 09/19/2010    Medications Prior to Admission  Medication Sig Dispense Refill Last Dose  . folic acid (FOLVITE) 1 MG tablet Take 1 mg by mouth daily.       Past Medical History:  Diagnosis Date  . Allergy    seasonal, food  . Asthma   . Chlamydia contact, treated    Age 71   . Constipation 08/2008  . H/O candidiasis   . H/O varicella   . Hx of chlamydia infection    2010  . Irregular periods/menstrual cycles 08/10/2011     No current facility-administered medications on file prior to encounter.   Current Outpatient Medications on File Prior to Encounter  Medication Sig Dispense Refill  . folic acid (FOLVITE) 1 MG tablet Take 1 mg by mouth daily.       Allergies  Allergen Reactions  . Peanut Oil Anaphylaxis  . 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 Shellfish Allergy Anaphylaxis  . Latex Itching    History of present pregnancy: Pt Info/Preference:  Screening/Consents:  Labs:   EDD: Estimated  Date of Delivery: 06/30/19  Establised: Patient's last menstrual period was 09/23/2018.  Anatomy Scan: Date: 02/03/2019 Placenta Location: posterior Genetic Screen: Panoroma:low risk female AFP:  First Tri: Quad:  Office: [facility]            First PNV: 12.4 wg Blood Type --/--/PENDING (05/08 0515) B+  Language: english Last PNV: 35.5 wg Rhogam  N/A  Flu Vaccine:  declined   Antibody PENDING (05/08 0515)  TDaP vaccine declined   GTT: Early: wnl Third Trimester: wnl  Feeding Plan: bottle BTL: no Rubella:  Immune  Contraception: ??? VBAC: no RPR:   NR  Circumcision: N/A   HBsAg:  Neg  Pediatrician:  ???   HIV:   NR  Prenatal Classes: no Additional [place]: no GBS:  Negative 5/4(For PCN allergy, check sensitivities)       Chlamydia: neg    MFM Referral/Consult:  GC: neg  Support Person: husband   PAP: ???  Pain Management: none Neonatologist Referral:  Hgb Electrophoresis:  AA  Birth Plan: natural   Hgb NOB: 12.8    28W: 10.2   OB History    Gravida  4   Para  2   Term  2   Preterm      AB      Living  2     SAB      TAB      Ectopic      Multiple  0   Live Births  2          Past Medical History:  Diagnosis Date  . Allergy    seasonal, food  . Asthma   . Chlamydia contact, treated    Age 45   . Constipation 08/2008  . H/O candidiasis   . H/O varicella   . Hx of chlamydia infection    2010  . Irregular periods/menstrual cycles 08/10/2011   Past Surgical History:  Procedure Laterality Date  . LAPAROSCOPY N/A 03/30/2017   Procedure: LAPAROSCOPY OPERATIVE WITH LEFT SALPINGECTOMY;  Surgeon: Osborne Oman, MD;  Location: [facility];  Service: Gynecology;  Laterality: N/A;  . NO PAST SURGERIES     Family History: family history includes Breast cancer in her maternal grandmother; Cancer in her maternal grandmother; Heart disease in her maternal uncle; Hyperlipidemia in her father; Hypertension in her mother; Kidney disease in her paternal grandmother. Social History:   reports that she has never smoked. She has never used smokeless tobacco. She reports that she does not drink alcohol or use drugs.   Prenatal Transfer Tool  Maternal Diabetes: No Genetic Screening: Normal ON MFM [place] 02/03/19, NORMAL PANORAMA, PATIENT DECLINED AMNIOCENTESIS Maternal Ultrasounds/Referrals: Normal Fetal Ultrasounds or other Referrals:  None Maternal Substance Abuse:  No Significant Maternal Medications:  None Significant Maternal Lab Results: Group B Strep negative  ROS:  Review of Systems  Constitutional: Negative.   HENT: Negative.   Eyes: Negative.   Respiratory: Negative.   Cardiovascular: Negative.   Gastrointestinal: Positive for abdominal pain.  Genitourinary: Negative.   Musculoskeletal: Negative.   Skin: Negative.   Neurological: Negative.   Endo/Heme/Allergies: Negative.   Psychiatric/Behavioral: Negative.      Physical Exam: BP 136/85   Pulse (!) 139   Resp 16   LMP 09/23/2018   Physical Exam  Constitutional: She is oriented to person, place, and time and well-developed, well-nourished, and in no distress.  HENT:  Head: Normocephalic and atraumatic.  Eyes: Pupils are equal, round, and reactive to light. Conjunctivae are normal.  Cardiovascular: Normal rate and regular rhythm.  Pulmonary/Chest: Effort normal and breath sounds normal.  Abdominal: Soft. Bowel sounds are normal.  Genitourinary:    Vagina and cervix normal.     Genitourinary Comments: Uterus gravida equal to dates, soft uterus and non-tender, no active lesion noted on cervix or external labia, pelvis proven to 8lbs   Musculoskeletal:        General: Normal range of motion.     Cervical back: Normal range of motion and neck supple.  Neurological: She is alert and oriented to person, place, and time. Gait normal.  Skin: Skin is warm and dry.  Psychiatric: Affect normal.  Nursing note and vitals reviewed.    NST: FHR baseline 150 bpm, Variability: moderate, Accelerations:present,  Decelerations:  Absent= Cat 1/Reactive UC:   regular, every 2-3 minutes SVE:   Dilation: 10 Effacement (%): 100 Station: -2 Exam by:: [facility], CNM, vertex verified by fetal sutures.  Leopold's: Position vertex, EFW 6-6.5 lbs via leopold's.   Labs: Results for orders placed or performed during the hospital encounter of 06/04/19 (from the past 24 hour(s))  CBC     Status: Abnormal   Collection Time: 06/04/19  5:15 AM  Result Value Ref Range   WBC 4.4 4.0 - 10.5 K/uL   RBC 3.88 3.87 - 5.11 MIL/uL   Hemoglobin 11.3 (L) 12.0 - 15.0 g/dL   HCT 35.0 (L) 36.0 - 46.0 %  MCV 90.2 80.0 - 100.0 fL   MCH 29.1 26.0 - 34.0 pg   MCHC 32.3 30.0 - 36.0 g/dL   RDW 15.7 26.2 - 03.5 %   Platelets 165 150 - 400 K/uL   nRBC 0.0 0.0 - 0.2 %  Type and screen [facility]     Status: None (Preliminary result)   Collection Time: 06/04/19  5:15 AM  Result Value Ref Range   ABO/RH(D) PENDING    Antibody Screen PENDING    Sample Expiration      06/07/2019,2359 Performed at [facility], [address] [place], [place] [address]     Imaging:  No results found.  MAU Course: Orders Placed This Encounter  Procedures  . Respiratory Panel by RT PCR (Flu A&B, Covid) - Nasopharyngeal Swab  . CBC  . RPR  . Diet clear liquid Room service appropriate? Yes; Fluid consistency: Thin  . Vital signs  . Notify Physician  . Activity as tolerated  . Fetal monitoring per unit policy  . Cervical Exam  . Measure blood pressure post delivery every 15 min x 1 hour then every 30 min x 1 hour  . Fundal check post delivery every 15 min x 1 hour then every 30 min x 1 hour  . If Rapid HIV test positive or known HIV positive: initiate AZT orders  . May in and out cath x 2 for inability to void  . Insert foley catheter  . Discontinue foley prior to vaginal delivery  . Patient may have epidural upon request  . Full code  . Type and screen [facility]  . Insert and maintain  IV Line  . Admit to Inpatient (patient's expected length of stay will be greater than 2 midnights or inpatient only procedure)   Meds ordered this encounter  Medications  . lactated ringers infusion  . oxytocin (PITOCIN) IV BOLUS FROM BAG  . oxytocin (PITOCIN) IV infusion 40 units in NS 1000 mL - Premix  . lactated ringers infusion 500-1,000 mL  . acetaminophen (TYLENOL) tablet 650 mg  . oxyCODONE-acetaminophen (PERCOCET/ROXICET) 5-325 MG per tablet 1 tablet  . oxyCODONE-acetaminophen (PERCOCET/ROXICET) 5-325 MG per tablet 2 tablet  . sodium phosphate (FLEET) 7-19 GM/118ML enema 1 enema  . ondansetron (ZOFRAN) injection 4 mg  . sodium citrate-citric acid (ORACIT) solution 30 mL  . lidocaine (PF) (XYLOCAINE) 1 % injection 30 mL  . oxytocin 40 units in NS 1000 mL 40 units/1000 mL infusion    [place], Casey : cabinet override    Assessment/Plan: SINAYA MINOGUE is a 29 y.o. 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 Allergy to latex, peanut, and shellfish. Fetal echogenicities found on [place] on 1/14 (ON MFM [place] 02/03/19, NORMAL PANORAMA, PATIENT DECLINED AMNIOCENTESIS.) Pt endorse + Fm. Denies vaginal leakage. Denies vaginal bleeding.   FWB: Cat 1 Fetal Tracing.   Plan: Admit to Birthing Suite per consult with DR Richardson Dopp Routine [facility] orders Pain med/epidural prn Anticipate labor progression   Dale [place] NP-C, CNM, MSN 06/04/2019, 6:07 AM

## 2019-06-04 NOTE — Progress Notes (Unsigned)
Documentation for MAU visit on 04/16/2019:   Patient Cynthia Mcdowell was seen in MAU. Her NST documentation is as follows: NST reactive, baseline 150, mod var, present acel, neg decels, uterine irritability.   Luna Kitchens

## 2019-06-04 NOTE — MAU Note (Addendum)
Vaginal bleeding X . Possible labor or contractions.

## 2019-06-05 LAB — CBC
HCT: 31 % — ABNORMAL LOW (ref 36.0–46.0)
Hemoglobin: 10 g/dL — ABNORMAL LOW (ref 12.0–15.0)
MCH: 28.7 pg (ref 26.0–34.0)
MCHC: 32.3 g/dL (ref 30.0–36.0)
MCV: 89.1 fL (ref 80.0–100.0)
Platelets: 155 10*3/uL (ref 150–400)
RBC: 3.48 MIL/uL — ABNORMAL LOW (ref 3.87–5.11)
RDW: 12.7 % (ref 11.5–15.5)
WBC: 4.2 10*3/uL (ref 4.0–10.5)
nRBC: 0 % (ref 0.0–0.2)

## 2019-06-05 NOTE — Discharge Summary (Signed)
OB Discharge Summary     Patient Name: PACHIA STRUM DOB: 1990/03/05 MRN: [id number]  Date of admission: 06/04/2019 Delivering MD: Dale [place]   Date of discharge: 06/05/2019  Admitting diagnosis: Indication for care in labor or delivery [O75.9] Intrauterine pregnancy: [redacted]w[redacted]d     Secondary diagnosis:  Active Problems:   Indication for care in labor or delivery  Additional problems: None     Discharge diagnosis: Preterm Pregnancy Delivered                                                                                                Post partum procedures:None  Augmentation: None  Complications: None  Hospital course:  Onset of Labor With Vaginal Delivery     29 y.o. yo [phone] at [redacted]w[redacted]d was admitted in Active Labor on 06/04/2019. Patient had an uncomplicated labor course as follows:  Membrane Rupture Time/Date: 5:43 AM ,06/04/2019   Intrapartum Procedures: Episiotomy: None [1]                                         Lacerations:  None [1]  Patient had a delivery of a Viable infant. 06/04/2019  Information for the patient's newborn:  Dawn, [place] [[id number]]  Delivery Method: Vag-Spont     Pateint had an uncomplicated postpartum course.  She is ambulating, tolerating a regular diet, passing flatus, and urinating well. Patient is discharged home in stable condition on 06/05/19.   Physical exam  Vitals:   06/04/19 1417 06/04/19 1804 06/04/19 2045 06/05/19 0525  BP: 104/63 106/68 102/65 109/64  Pulse: 68 72 91 79  Resp: 18 18 18 18   Temp: 98.4 F (36.9 C) 98.3 F (36.8 C) 97.7 F (36.5 C) 98.4 F (36.9 C)  TempSrc: Oral Oral Oral Oral  SpO2: 100% 100% 100% 100%   General: alert, cooperative and no distress Lochia: appropriate Uterine Fundus: firm Incision: N/A DVT Evaluation: No evidence of DVT seen on physical exam. Labs: Lab Results  Component Value Date   WBC 4.2 06/05/2019   HGB 10.0 (L) 06/05/2019   HCT 31.0 (L) 06/05/2019   MCV 89.1 06/05/2019   PLT  155 06/05/2019   CMP Latest Ref Rng & Units 04/16/2019  Glucose 70 - 99 mg/dL 84  BUN 6 - 20 mg/dL 04/18/2019)  Creatinine <3(G - 1.00 mg/dL 5.49  Sodium 8.26 - 415 mmol/L 137  Potassium 3.5 - 5.1 mmol/L 3.7  Chloride 98 - 111 mmol/L 103  CO2 22 - 32 mmol/L 24  Calcium 8.9 - 10.3 mg/dL 830)  Total Protein 6.5 - 8.1 g/dL 6.9  Total Bilirubin 0.3 - 1.2 mg/dL 0.9  Alkaline Phos 38 - 126 U/L 138(H)  AST 15 - 41 U/L 22  ALT 0 - 44 U/L 11    Discharge instruction: per After Visit Summary and "Baby and Me Booklet".  After visit meds:  Allergies as of 06/05/2019      Reactions   Peanut Oil Anaphylaxis   Peanut-containing Drug Products Anaphylaxis  Allergic to all nuts   Shellfish Allergy Anaphylaxis   Latex Itching      Medication List    STOP taking these medications   folic acid 1 MG tablet Commonly known as: FOLVITE       Diet: routine diet  Activity: Advance as tolerated. Pelvic rest for 6 weeks.   Outpatient follow up:6 weeks Follow up Appt:No future appointments. Follow up Visit:No follow-ups on file.  Postpartum contraception: Nexplanon  Newborn Data: Live born female  Birth Weight: 5 lb 14.2 oz (2671 g) APGAR: 8, 7  Newborn Delivery   Birth date/time: 06/04/2019 05:48:00 Delivery type: Vaginal, Spontaneous      Baby Feeding: Bottle and Breast Disposition:home with mother   06/05/2019 Starla Link, CNM

## 2019-06-05 NOTE — Discharge Instructions (Signed)

## 2019-06-07 LAB — SURGICAL PATHOLOGY

## 2019-07-28 DIAGNOSIS — Z419 Encounter for procedure for purposes other than remedying health state, unspecified: Secondary | ICD-10-CM | POA: Diagnosis not present

## 2019-08-08 DIAGNOSIS — N912 Amenorrhea, unspecified: Secondary | ICD-10-CM | POA: Diagnosis not present

## 2019-08-08 DIAGNOSIS — Z113 Encounter for screening for infections with a predominantly sexual mode of transmission: Secondary | ICD-10-CM | POA: Diagnosis not present

## 2019-08-28 DIAGNOSIS — Z419 Encounter for procedure for purposes other than remedying health state, unspecified: Secondary | ICD-10-CM | POA: Diagnosis not present

## 2019-09-24 ENCOUNTER — Emergency Department (HOSPITAL_COMMUNITY)
Admission: EM | Admit: 2019-09-24 | Discharge: 2019-09-24 | Disposition: A | Discharge status: Home / Self Care | Payer: Medicaid Other | Attending: Emergency Medicine | Admitting: Emergency Medicine

## 2019-09-24 ENCOUNTER — Emergency Department (HOSPITAL_COMMUNITY): Payer: Medicaid Other

## 2019-09-24 ENCOUNTER — Encounter (HOSPITAL_COMMUNITY): Payer: Self-pay | Admitting: Emergency Medicine

## 2019-09-24 ENCOUNTER — Other Ambulatory Visit: Payer: Self-pay

## 2019-09-24 DIAGNOSIS — Y939 Activity, unspecified: Secondary | ICD-10-CM | POA: Insufficient documentation

## 2019-09-24 DIAGNOSIS — M25521 Pain in right elbow: Secondary | ICD-10-CM | POA: Diagnosis not present

## 2019-09-24 DIAGNOSIS — Z91040 Latex allergy status: Secondary | ICD-10-CM | POA: Diagnosis not present

## 2019-09-24 DIAGNOSIS — Y999 Unspecified external cause status: Secondary | ICD-10-CM | POA: Insufficient documentation

## 2019-09-24 DIAGNOSIS — Z041 Encounter for examination and observation following transport accident: Secondary | ICD-10-CM | POA: Diagnosis not present

## 2019-09-24 DIAGNOSIS — M542 Cervicalgia: Secondary | ICD-10-CM | POA: Diagnosis not present

## 2019-09-24 DIAGNOSIS — M5412 Radiculopathy, cervical region: Secondary | ICD-10-CM | POA: Diagnosis not present

## 2019-09-24 DIAGNOSIS — J45909 Unspecified asthma, uncomplicated: Secondary | ICD-10-CM | POA: Diagnosis not present

## 2019-09-24 DIAGNOSIS — Z91010 Allergy to peanuts: Secondary | ICD-10-CM | POA: Diagnosis not present

## 2019-09-24 DIAGNOSIS — Y929 Unspecified place or not applicable: Secondary | ICD-10-CM | POA: Insufficient documentation

## 2019-09-24 LAB — POC URINE PREG, ED: Preg Test, Ur: NEGATIVE

## 2019-09-24 MED ORDER — PREDNISONE 20 MG PO TABS
60.0000 mg | ORAL_TABLET | Freq: Once | ORAL | Status: AC
  Administered 2019-09-24: 60 mg via ORAL
  Filled 2019-09-24: qty 3

## 2019-09-24 MED ORDER — PREDNISONE 10 MG (21) PO TBPK
ORAL_TABLET | ORAL | 0 refills | Status: DC
Start: 2019-09-24 — End: 2020-06-09

## 2019-09-24 MED ORDER — IBUPROFEN 400 MG PO TABS
600.0000 mg | ORAL_TABLET | Freq: Once | ORAL | Status: AC
  Administered 2019-09-24: 600 mg via ORAL
  Filled 2019-09-24: qty 1

## 2019-09-24 MED ORDER — IBUPROFEN 600 MG PO TABS
600.0000 mg | ORAL_TABLET | Freq: Four times a day (QID) | ORAL | 0 refills | Status: DC | PRN
Start: 2019-09-24 — End: 2020-06-09
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
## 2019-09-24 NOTE — ED Notes (Signed)
Patient verbalizes understanding of discharge instructions . Opportunity for questions and answers were provided . Armband removed by staff ,Pt discharged from ED. W/C  offered at D/C  and Declined W/C at D/C and was escorted to lobby by RN.  
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
## 2019-09-28 DIAGNOSIS — Z419 Encounter for procedure for purposes other than remedying health state, unspecified: Secondary | ICD-10-CM | POA: Diagnosis not present

## 2019-10-08 IMAGING — US US OB < 14 WEEKS - US OB TV
1 series · 15 of 28 positions shown · non-contrast
Comparison: None.

CLINICAL DATA: Pelvic pain, quantitative HCG pending

EXAM:
OBSTETRIC <14 WK US AND TRANSVAGINAL OB US
TECHNIQUE: Both transabdominal and transvaginal ultrasound examinations were
performed for complete evaluation of the gestation as well as the
maternal uterus, adnexal regions, and pelvic cul-de-sac.
Transvaginal technique was performed to assess early pregnancy.

[Series 1: us ob < 14 weeks - us ob tv · 59 acquisitions, 15 frames shown]
[im 1/59]
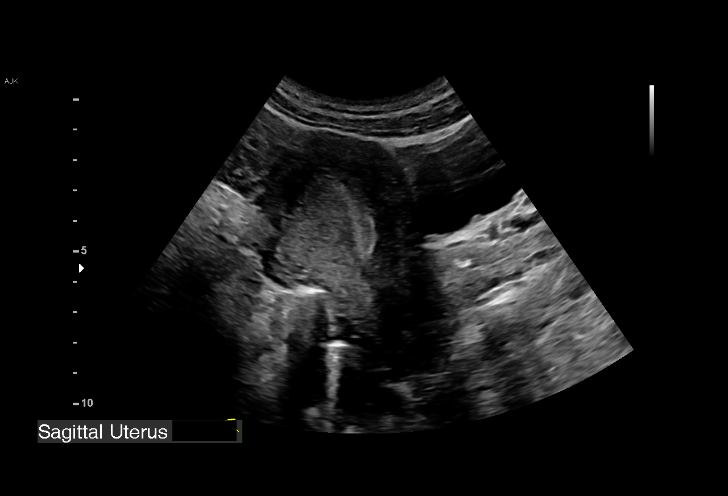
[im 5/59]
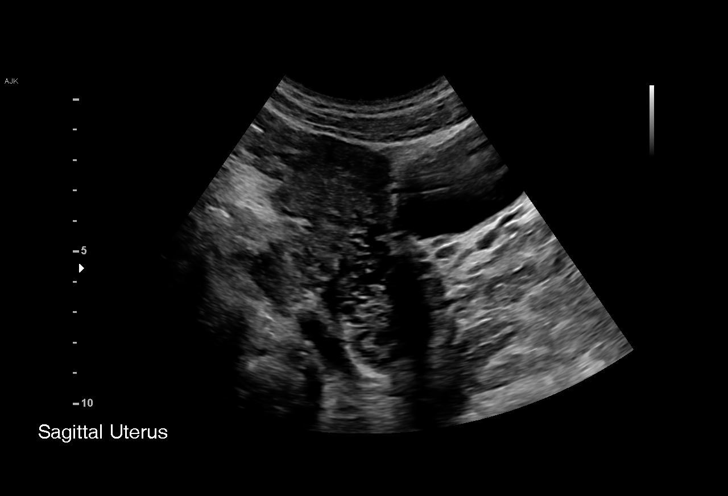
[im 9/59]
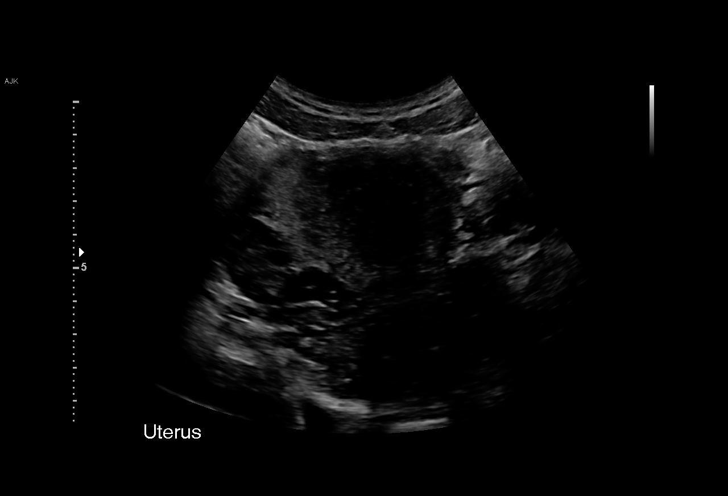
[im 13/59]
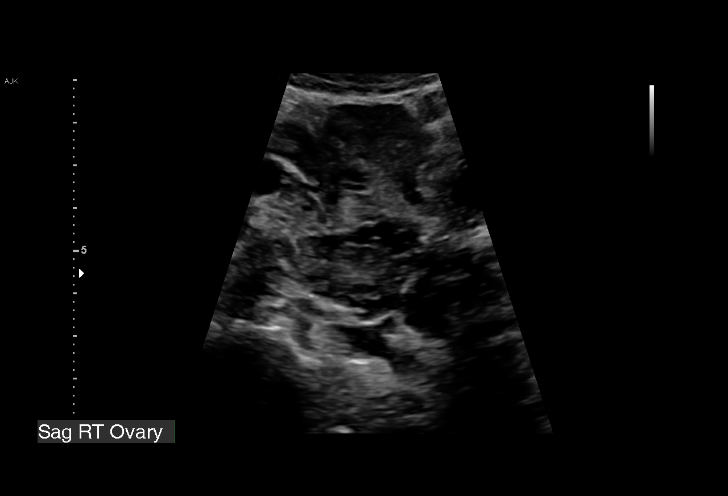
[im 18/59]
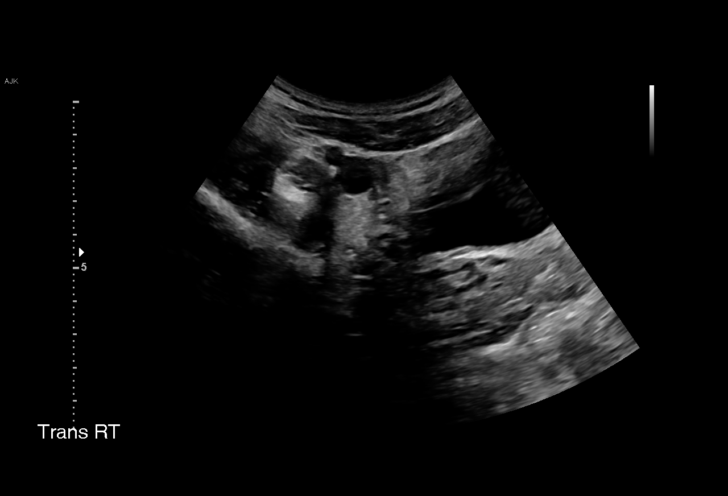
[im 22/59]
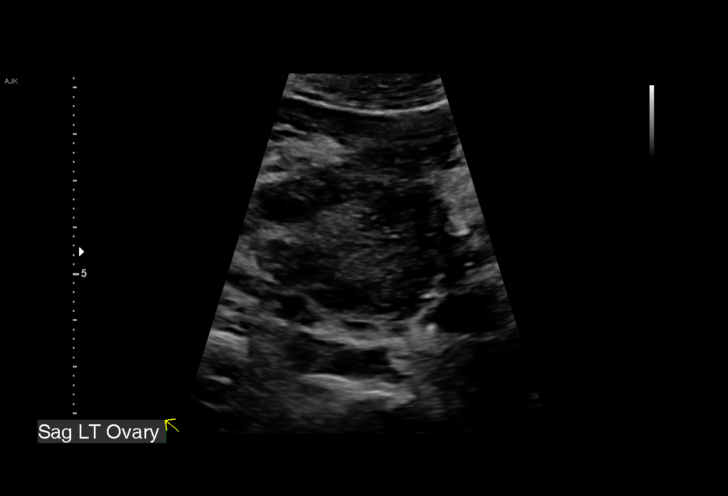
[im 26/59]
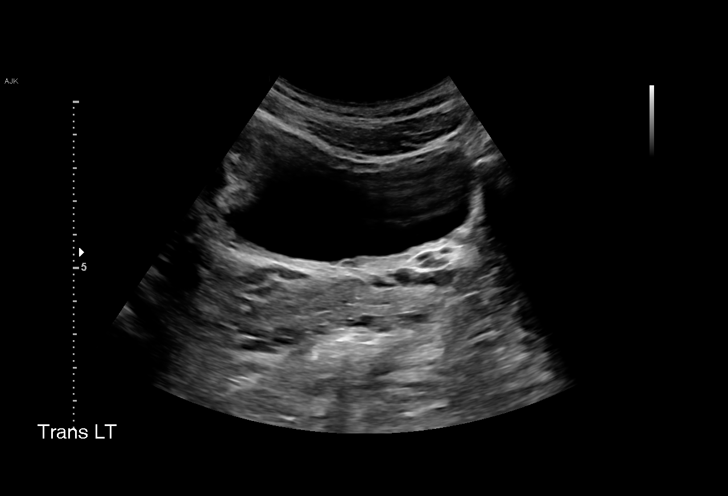
[im 31/59]
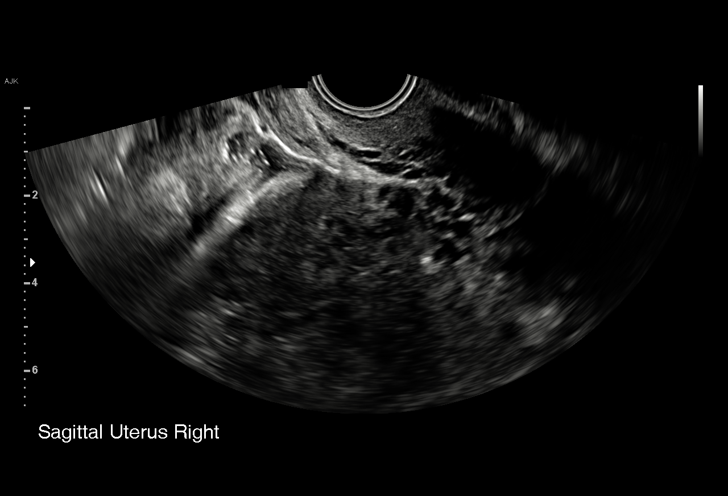
[im 33/59]
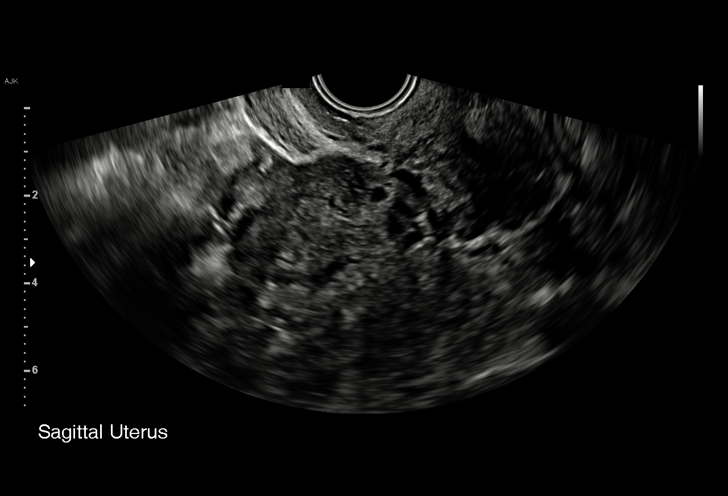
[im 37/59]
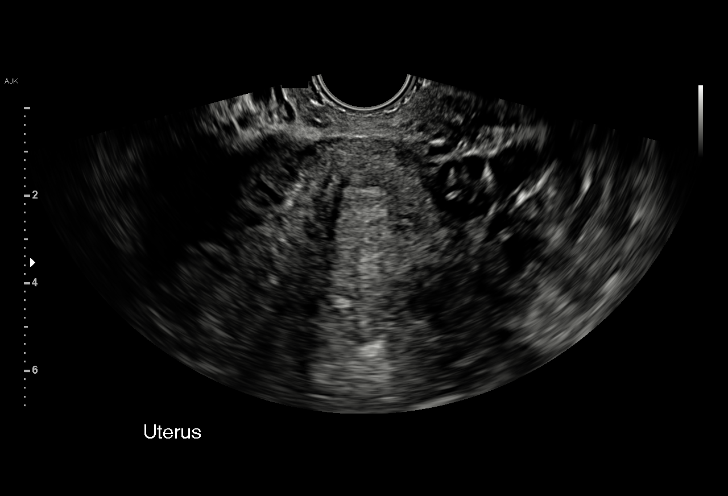
[im 41/59]
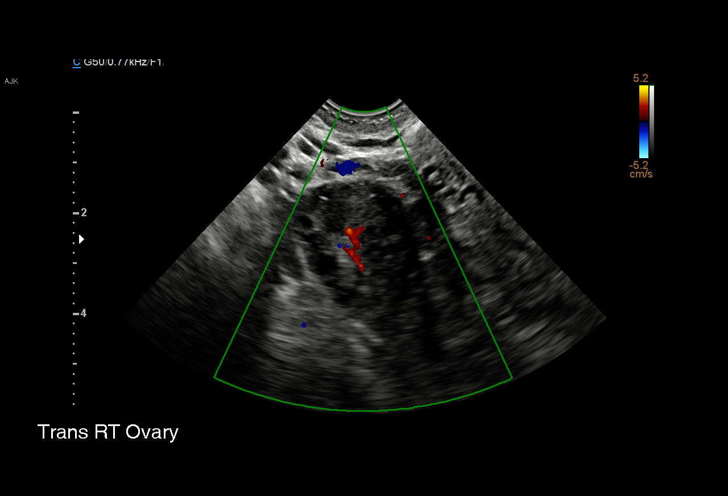
[im 46/59]
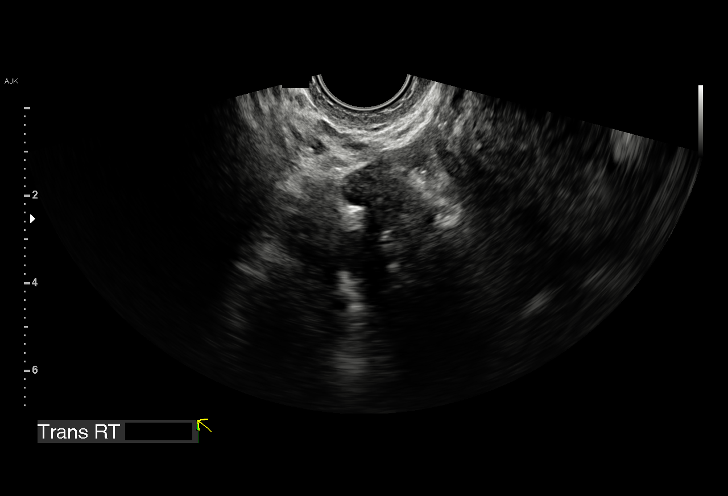
[im 50/59]
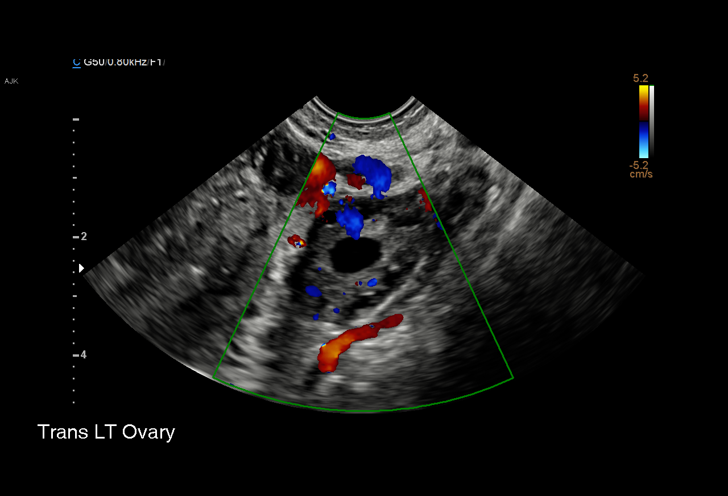
[im 54/59]
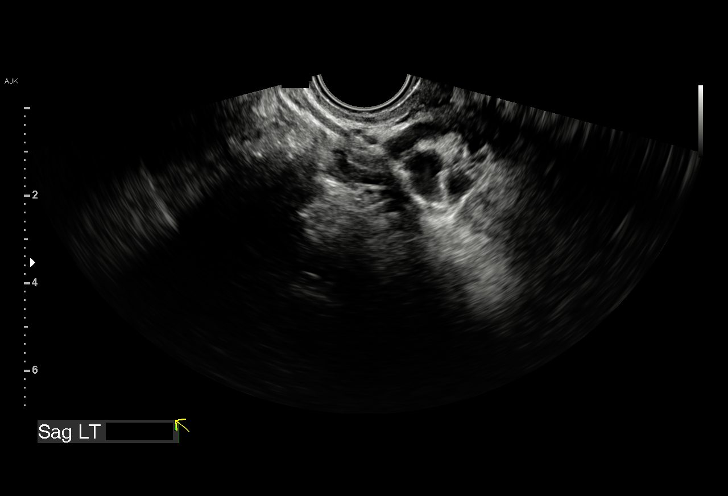
[im 59/59]
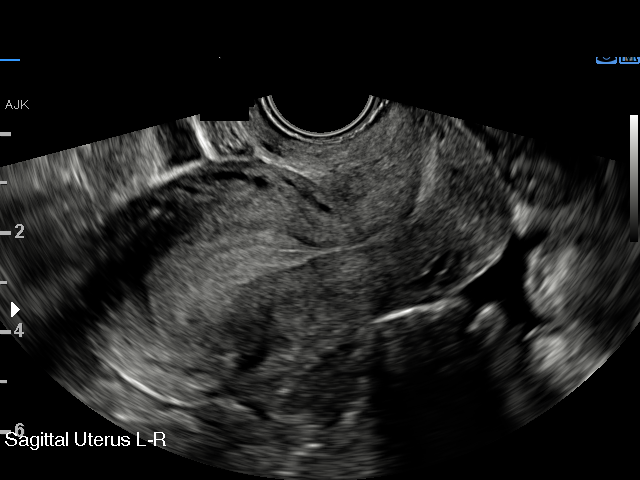

[15 of 28 positions shown; findings below may reference images not displayed]

FINDINGS: Intrauterine gestational sac: Not visible

Yolk sac:  Not visible

Embryo:  Not visible

Subchorionic hemorrhage:  None visualized.

Maternal uterus/adnexae: Bilateral ovaries are within normal limits.
Right ovary measures 3.8 x 2.3 x 2.3 cm. Left ovary measures 3.6 x
2.3 x 2.2 cm. Trace amount of free fluid in the pelvis. Possible
luteal cyst in the left ovary.
IMPRESSION: No intrauterine pregnancy is visualized. Findings would be
consistent with pregnancy of unknown location, differential of which
includes IUP too early to visualize, occult ectopic, and recent
failed pregnancy. Recommend trending of HCG with repeat ultrasound
as indicated.

Trace amount of free fluid in the pelvis

## 2019-10-17 DIAGNOSIS — Z113 Encounter for screening for infections with a predominantly sexual mode of transmission: Secondary | ICD-10-CM | POA: Diagnosis not present

## 2019-10-17 DIAGNOSIS — N912 Amenorrhea, unspecified: Secondary | ICD-10-CM | POA: Diagnosis not present

## 2019-10-28 DIAGNOSIS — Z419 Encounter for procedure for purposes other than remedying health state, unspecified: Secondary | ICD-10-CM | POA: Diagnosis not present

## 2019-11-02 DIAGNOSIS — R112 Nausea with vomiting, unspecified: Secondary | ICD-10-CM | POA: Diagnosis not present

## 2019-11-02 DIAGNOSIS — Z3A08 8 weeks gestation of pregnancy: Secondary | ICD-10-CM | POA: Diagnosis not present

## 2019-11-02 DIAGNOSIS — N912 Amenorrhea, unspecified: Secondary | ICD-10-CM | POA: Diagnosis not present

## 2019-11-02 DIAGNOSIS — O3680X9 Pregnancy with inconclusive fetal viability, other fetus: Secondary | ICD-10-CM | POA: Diagnosis not present

## 2019-11-14 DIAGNOSIS — Z3492 Encounter for supervision of normal pregnancy, unspecified, second trimester: Secondary | ICD-10-CM | POA: Diagnosis not present

## 2019-11-23 LAB — OB RESULTS CONSOLE HIV ANTIBODY (ROUTINE TESTING): HIV: NONREACTIVE

## 2019-11-23 LAB — OB RESULTS CONSOLE RUBELLA ANTIBODY, IGM: Rubella: IMMUNE

## 2019-11-23 LAB — OB RESULTS CONSOLE RPR: RPR: NONREACTIVE

## 2019-11-23 LAB — OB RESULTS CONSOLE GC/CHLAMYDIA
Chlamydia: NEGATIVE
Gonorrhea: NEGATIVE

## 2019-11-23 LAB — OB RESULTS CONSOLE HEPATITIS B SURFACE ANTIGEN: Hepatitis B Surface Ag: NEGATIVE

## 2019-11-28 DIAGNOSIS — Z419 Encounter for procedure for purposes other than remedying health state, unspecified: Secondary | ICD-10-CM | POA: Diagnosis not present

## 2019-12-28 DIAGNOSIS — Z419 Encounter for procedure for purposes other than remedying health state, unspecified: Secondary | ICD-10-CM | POA: Diagnosis not present

## 2020-01-28 DIAGNOSIS — Z419 Encounter for procedure for purposes other than remedying health state, unspecified: Secondary | ICD-10-CM | POA: Diagnosis not present

## 2020-01-28 NOTE — L&D Delivery Note (Signed)
Delivery Note Labor onset: 06/07/2020  Labor Onset Time: 1300 Complete dilation at 11:46 PM  Onset of pushing at 2346 FHR second stage Cat 1 Analgesia/Anesthesia intrapartum: none  CNM called to room for imminent delivery. Infant being dried by RN when CNM arrived in room.   Cord double clamped after pulsation completed and cut by patient.  Cynthia Mcdowell, father,present for birth.  Cord blood sample collected: Yes Arterial cord blood sample collected: N/A  Placenta delivered Cynthia Mcdowell, intact, with 3 VC.  Placenta to L&D. Uterine tone firm, bleeding scant Pitocin 10 Units IM   No laceration identified.  Anesthesia: none Repair: N/A QBL/EBL (mL): 100 Complications: none APGAR: APGAR (1 MIN): 8   APGAR (5 MINS): 9   APGAR (10 MINS):   Mom to postpartum.  Baby to Couplet care / Skin to Skin.  Roma Schanz MSN, CNM 06/08/2020, 2:53 AM

## 2020-02-01 DIAGNOSIS — Z3402 Encounter for supervision of normal first pregnancy, second trimester: Secondary | ICD-10-CM | POA: Diagnosis not present

## 2020-02-01 DIAGNOSIS — J45909 Unspecified asthma, uncomplicated: Secondary | ICD-10-CM | POA: Diagnosis not present

## 2020-02-01 DIAGNOSIS — Z8751 Personal history of pre-term labor: Secondary | ICD-10-CM | POA: Diagnosis not present

## 2020-02-01 DIAGNOSIS — Z3A21 21 weeks gestation of pregnancy: Secondary | ICD-10-CM | POA: Diagnosis not present

## 2020-02-01 DIAGNOSIS — Z331 Pregnant state, incidental: Secondary | ICD-10-CM | POA: Diagnosis not present

## 2020-02-01 DIAGNOSIS — O289 Unspecified abnormal findings on antenatal screening of mother: Secondary | ICD-10-CM | POA: Diagnosis not present

## 2020-02-01 DIAGNOSIS — O009 Unspecified ectopic pregnancy without intrauterine pregnancy: Secondary | ICD-10-CM | POA: Diagnosis not present

## 2020-02-01 DIAGNOSIS — Z363 Encounter for antenatal screening for malformations: Secondary | ICD-10-CM | POA: Diagnosis not present

## 2020-02-28 DIAGNOSIS — Z419 Encounter for procedure for purposes other than remedying health state, unspecified: Secondary | ICD-10-CM | POA: Diagnosis not present

## 2020-03-01 DIAGNOSIS — O283 Abnormal ultrasonic finding on antenatal screening of mother: Secondary | ICD-10-CM | POA: Diagnosis not present

## 2020-03-01 DIAGNOSIS — Z3A25 25 weeks gestation of pregnancy: Secondary | ICD-10-CM | POA: Diagnosis not present

## 2020-03-27 DIAGNOSIS — Z419 Encounter for procedure for purposes other than remedying health state, unspecified: Secondary | ICD-10-CM | POA: Diagnosis not present

## 2020-03-29 DIAGNOSIS — Z369 Encounter for antenatal screening, unspecified: Secondary | ICD-10-CM | POA: Diagnosis not present

## 2020-04-26 DIAGNOSIS — Z3A33 33 weeks gestation of pregnancy: Secondary | ICD-10-CM | POA: Diagnosis not present

## 2020-04-26 DIAGNOSIS — O283 Abnormal ultrasonic finding on antenatal screening of mother: Secondary | ICD-10-CM | POA: Diagnosis not present

## 2020-04-27 DIAGNOSIS — Z419 Encounter for procedure for purposes other than remedying health state, unspecified: Secondary | ICD-10-CM | POA: Diagnosis not present

## 2020-05-17 DIAGNOSIS — Z3483 Encounter for supervision of other normal pregnancy, third trimester: Secondary | ICD-10-CM | POA: Diagnosis not present

## 2020-05-17 LAB — OB RESULTS CONSOLE GBS: GBS: NEGATIVE

## 2020-05-27 DIAGNOSIS — Z419 Encounter for procedure for purposes other than remedying health state, unspecified: Secondary | ICD-10-CM | POA: Diagnosis not present

## 2020-05-29 DIAGNOSIS — R931 Abnormal findings on diagnostic imaging of heart and coronary circulation: Secondary | ICD-10-CM | POA: Diagnosis not present

## 2020-06-07 ENCOUNTER — Other Ambulatory Visit: Payer: Self-pay

## 2020-06-07 ENCOUNTER — Inpatient Hospital Stay (HOSPITAL_COMMUNITY)
Admission: AD | Admit: 2020-06-07 | Discharge: 2020-06-09 | DRG: 807 | Disposition: A | Discharge status: Home / Self Care | Payer: Medicaid Other | Attending: Obstetrics & Gynecology | Admitting: Obstetrics & Gynecology

## 2020-06-07 ENCOUNTER — Encounter (HOSPITAL_COMMUNITY): Payer: Self-pay | Admitting: Obstetrics and Gynecology

## 2020-06-07 DIAGNOSIS — O26893 Other specified pregnancy related conditions, third trimester: Secondary | ICD-10-CM | POA: Diagnosis not present

## 2020-06-07 DIAGNOSIS — Z3A39 39 weeks gestation of pregnancy: Secondary | ICD-10-CM | POA: Diagnosis not present

## 2020-06-07 DIAGNOSIS — Z20822 Contact with and (suspected) exposure to covid-19: Secondary | ICD-10-CM | POA: Diagnosis not present

## 2020-06-07 DIAGNOSIS — R768 Other specified abnormal immunological findings in serum: Secondary | ICD-10-CM | POA: Diagnosis present

## 2020-06-07 DIAGNOSIS — Z90721 Acquired absence of ovaries, unilateral: Secondary | ICD-10-CM

## 2020-06-07 LAB — CBC
HCT: 35.6 % — ABNORMAL LOW (ref 36.0–46.0)
Hemoglobin: 11.5 g/dL — ABNORMAL LOW (ref 12.0–15.0)
MCH: 28.5 pg (ref 26.0–34.0)
MCHC: 32.3 g/dL (ref 30.0–36.0)
MCV: 88.1 fL (ref 80.0–100.0)
Platelets: 167 10*3/uL (ref 150–400)
RBC: 4.04 MIL/uL (ref 3.87–5.11)
RDW: 12.4 % (ref 11.5–15.5)
WBC: 4.9 10*3/uL (ref 4.0–10.5)
nRBC: 0 % (ref 0.0–0.2)

## 2020-06-07 LAB — TYPE AND SCREEN
ABO/RH(D): B POS
Antibody Screen: NEGATIVE

## 2020-06-07 LAB — RESP PANEL BY RT-PCR (FLU A&B, COVID) ARPGX2
Influenza A by PCR: NEGATIVE
Influenza B by PCR: NEGATIVE
SARS Coronavirus 2 by RT PCR: NEGATIVE

## 2020-06-07 MED ORDER — LIDOCAINE HCL (PF) 1 % IJ SOLN: 30.0000 mL | INTRAMUSCULAR | Status: DC | PRN

## 2020-06-07 MED ORDER — OXYCODONE-ACETAMINOPHEN 5-325 MG PO TABS: 1.0000 | ORAL_TABLET | ORAL | Status: DC | PRN

## 2020-06-07 MED ORDER — LACTATED RINGERS IV SOLN: INTRAVENOUS | Status: DC

## 2020-06-07 MED ORDER — SOD CITRATE-CITRIC ACID 500-334 MG/5ML PO SOLN: 30.0000 mL | ORAL | Status: DC | PRN

## 2020-06-07 MED ORDER — OXYCODONE-ACETAMINOPHEN 5-325 MG PO TABS: 2.0000 | ORAL_TABLET | ORAL | Status: DC | PRN

## 2020-06-07 MED ORDER — ACETAMINOPHEN 325 MG PO TABS: 650.0000 mg | ORAL_TABLET | ORAL | Status: DC | PRN

## 2020-06-07 MED ORDER — FLEET ENEMA 7-19 GM/118ML RE ENEM: 1.0000 | ENEMA | RECTAL | Status: DC | PRN

## 2020-06-07 MED ORDER — LACTATED RINGERS IV SOLN: 500.0000 mL | INTRAVENOUS | Status: DC | PRN

## 2020-06-07 MED ORDER — OXYTOCIN-SODIUM CHLORIDE 30-0.9 UT/500ML-% IV SOLN
2.5000 [IU]/h | INTRAVENOUS | Status: DC
  Filled 2020-06-07 (×2): qty 500

## 2020-06-07 MED ORDER — ONDANSETRON HCL 4 MG/2ML IJ SOLN: 4.0000 mg | Freq: Four times a day (QID) | INTRAMUSCULAR | Status: DC | PRN

## 2020-06-07 MED ORDER — OXYTOCIN BOLUS FROM INFUSION: 333.0000 mL | Freq: Once | INTRAVENOUS | Status: DC

## 2020-06-07 NOTE — Progress Notes (Signed)
Subjective:    Coping well w/ labor, denies need for pain medication or epidural at this time. Request amniotomy.   Objective:    VS: BP (!) 90/57   Pulse 93   Temp 98.3 F (36.8 C) (Oral)   Resp 16   Ht 5\' 3"  (1.6 m)   Wt 75.3 kg   LMP 08/24/2019   SpO2 99% Comment: room air  BMI 29.41 kg/m  FHR : baseline 140 / variability moderate / accelerations present / absent decelerations Toco: contractions every 2-5 minutes  Membranes: AROM, clear, scant Dilation: 7 Effacement (%): 80 Station: -2 Presentation: Vertex Exam by:: [email or web address]   Assessment/Plan:   30 y.o. 37 [redacted]w[redacted]d  Labor: Progressing normally Fetal Wellbeing:  Category I Pain Control:  Labor support without medications I/D:  GBS neg Anticipated MOD:  NSVD  [redacted]w[redacted]d MSN, CNM 06/07/2020 11:10 PM

## 2020-06-07 NOTE — H&P (Incomplete)
OB ADMISSION/ HISTORY & PHYSICAL:  Admission Date: 06/07/2020  4:24 PM  Admit Diagnosis: ***  Cynthia Mcdowell is a 30 y.o. female [phone] [redacted]w[redacted]d presenting for ***. Endorses active FM, denies LOF and vaginal bleeding. Ctx began @ ***  History of current pregnancy: [id number]   Primary OB Provider: *** Patient entered care with *** at *** wks.   EDC *** by US/LMP*** and congruent w/ *** wk U/S.   Anatomy scan:  *** wks, complete w/ *** placenta.   Antenatal testing: for *** started at *** weeks Last evaluation: ***  wks  Significant prenatal events: *** Patient Active Problem List   Diagnosis Date Noted  . Indication for care in labor or delivery 06/04/2019  . Normal postpartum course 04/19/2018  . Normal labor and delivery 04/18/2018  . SVD (spontaneous vaginal delivery) 04/18/2018  . Leukopenia 03/30/2017  . Tubal ectopic pregnancy 03/30/2017  . Irregular periods/menstrual cycles   . Hx of chlamydia infection   . Asthma 09/19/2010  . Environmental allergies 09/19/2010  . Back pain 09/19/2010  . Eczema 09/19/2010  . Constipation 09/19/2010    Prenatal Labs: ABO, Rh: --/--/B POS (05/12 1717) Antibody: NEG (05/12 1717) Rubella: Immune (10/27 0000) *** RPR: Nonreactive (10/27 0000) *** HBsAg: Negative (10/27 0000) *** HIV: Non-reactive (10/27 0000) *** GTT: *** GBS: Negative/-- (04/21 0000) *** GC/CHL: *** Genetics: *** Tdap/influenza vaccines: ***   OB History  Gravida Para Term Preterm AB Living  5 3 2 1 1 3   SAB IAB Ectopic Multiple Live Births      1 0 3    # Outcome Date GA Lbr Len/2nd Weight Sex Delivery Anes PTL Lv  5 Current           4 Preterm 06/04/19 [redacted]w[redacted]d 01:46 / 00:02 2671 g F Vag-Spont None  LIV     Birth Comments: WNL  3 Term 04/18/18 [redacted]w[redacted]d 03:00 / 00:04 3515 g F Vag-Spont None  LIV  2 Ectopic 2019          1 Term 06/24/11 [redacted]w[redacted]d 03:36 / 00:02 3209 g F Vag-Spont Local  LIV    Medical / Surgical History: Past medical history:  Past Medical History:   Diagnosis Date  . Allergy    seasonal, food  . Asthma    last used inhaler a while ago  . Chlamydia contact, treated    Age 26   . Constipation 08/2008  . H/O candidiasis   . H/O varicella   . Hx of chlamydia infection    2010  . Irregular periods/menstrual cycles 08/10/2011    Past surgical history:  Past Surgical History:  Procedure Laterality Date  . LAPAROSCOPY N/A 03/30/2017   Procedure: LAPAROSCOPY OPERATIVE WITH LEFT SALPINGECTOMY;  Surgeon: 05/30/2017, MD;  Location: [facility];  Service: Gynecology;  Laterality: N/A;   Family History:  Family History  Problem Relation Age of Onset  . Breast cancer Maternal Grandmother   . Cancer Maternal Grandmother   . Hypertension Mother   . Hyperlipidemia Father   . Heart disease Maternal Uncle   . Kidney disease Paternal Grandmother   . Anesthesia problems Neg Hx   . Hypotension Neg Hx   . Malignant hyperthermia Neg Hx   . Pseudochol deficiency Neg Hx     Social History:  reports that she has never smoked. She has never used smokeless tobacco. She reports that she does not drink alcohol and does not use drugs.  Allergies: Peanut oil, Peanut-containing drug products, Shellfish  allergy, and Latex   Current Medications at time of admission:  Prior to Admission medications   Medication Sig Start Date End Date Taking? Authorizing Provider  ibuprofen (ADVIL) 600 MG tablet Take 1 tablet (600 mg total) by mouth every 6 (six) hours as needed. 09/24/19   Jacalyn Lefevre, MD  predniSONE (STERAPRED UNI-PAK 21 TAB) 10 MG (21) TBPK tablet Take 6 tabs for 2 days, then 5 for 2 days, then 4 for 2 days, then 3 for 2 days, 2 for 2 days, then 1 for 2 days 09/24/19   Jacalyn Lefevre, MD    Review of Systems: Constitutional: Negative   HENT: Negative   Eyes: Negative   Respiratory: Negative   Cardiovascular: Negative   Gastrointestinal: Negative  Genitourinary: *** for bloody show, *** for LOF   Musculoskeletal: Negative   Skin: Negative    Neurological: Negative   Endo/Heme/Allergies: Negative   Psychiatric/Behavioral: Negative    Physical Exam: VS: Blood pressure (!) 90/57, pulse 93, temperature 98.3 F (36.8 C), temperature source Oral, resp. rate 16, height 5\' 3"  (1.6 m), weight 75.3 kg, last menstrual period 08/24/2019, SpO2 99 %, unknown if currently breastfeeding. AAO x3, no signs of distress Cardiovascular: RRR Respiratory: Lung fields clear to ausculation GU/GI: Abdomen gravid, non-tender, non-distended, active FM, vertex, EFW *** per Leopold's Extremities: *** edema, negative for pain, tenderness, and cords  Cervical exam:Dilation: 7 Effacement (%): 80 Station: -2 Exam by:: [email or web address] FHR: baseline rate *** / variability *** / accelerations *** / *** decelerations TOCO: ***   Prenatal Transfer Tool  Maternal Diabetes: {Maternal Diabetes:3043596} Genetic Screening: {Genetic Screening:20205} Maternal Ultrasounds/Referrals: {Maternal Ultrasounds / Referrals:20211} Fetal Ultrasounds or other Referrals:  {Fetal Ultrasounds or Other Referrals:20213} Maternal Substance Abuse:  {Maternal Substance Abuse:20223} Significant Maternal Medications:  {Significant Maternal Meds:20233} Significant Maternal Lab Results: {Significant Maternal Lab Results:20235}    Assessment: 30 y.o. 37 [redacted]w[redacted]d  *** stage of labor FHR category *** GBS *** Pain management plan: ***   Plan:  Admit to L&D Routine admission orders Epidural PRN *** Dr [redacted]w[redacted]d notified of admission and plan of care  Marland Kitchen MSN, CNM 06/07/2020 11:27 PM

## 2020-06-07 NOTE — MAU Note (Signed)
Cynthia Mcdowell is a 30 y.o. at [redacted]w[redacted]d here in MAU reporting: contractions since 1300, they come every 5 minutes. No bleeding or LOF. +FM  Onset of complaint: today  Pain score: 5/10  Vitals:   06/07/20 1644  BP: (!) 110/36  Pulse: (!) 106  Resp: 16  Temp: 97.9 F (36.6 C)  SpO2: 99%     Lab orders placed from triage: none

## 2020-06-08 ENCOUNTER — Encounter (HOSPITAL_COMMUNITY): Payer: Self-pay | Admitting: Obstetrics and Gynecology

## 2020-06-08 DIAGNOSIS — Z3A39 39 weeks gestation of pregnancy: Secondary | ICD-10-CM | POA: Diagnosis not present

## 2020-06-08 LAB — CBC
HCT: 31.3 % — ABNORMAL LOW (ref 36.0–46.0)
Hemoglobin: 10.6 g/dL — ABNORMAL LOW (ref 12.0–15.0)
MCH: 29.4 pg (ref 26.0–34.0)
MCHC: 33.9 g/dL (ref 30.0–36.0)
MCV: 86.7 fL (ref 80.0–100.0)
Platelets: 147 10*3/uL — ABNORMAL LOW (ref 150–400)
RBC: 3.61 MIL/uL — ABNORMAL LOW (ref 3.87–5.11)
RDW: 12.3 % (ref 11.5–15.5)
WBC: 8.7 10*3/uL (ref 4.0–10.5)
nRBC: 0 % (ref 0.0–0.2)

## 2020-06-08 LAB — RPR: RPR Ser Ql: NONREACTIVE

## 2020-06-08 MED ORDER — ONDANSETRON HCL 4 MG/2ML IJ SOLN: 4.0000 mg | INTRAMUSCULAR | Status: DC | PRN

## 2020-06-08 MED ORDER — WITCH HAZEL-GLYCERIN EX PADS: 1.0000 "application " | MEDICATED_PAD | CUTANEOUS | Status: DC | PRN

## 2020-06-08 MED ORDER — SIMETHICONE 80 MG PO CHEW: 80.0000 mg | CHEWABLE_TABLET | ORAL | Status: DC | PRN

## 2020-06-08 MED ORDER — PRENATAL MULTIVITAMIN CH
1.0000 | ORAL_TABLET | Freq: Every day | ORAL | Status: DC
  Administered 2020-06-08 – 2020-06-09 (×2): 1 via ORAL
  Filled 2020-06-08 (×2): qty 1

## 2020-06-08 MED ORDER — ONDANSETRON HCL 4 MG PO TABS: 4.0000 mg | ORAL_TABLET | ORAL | Status: DC | PRN

## 2020-06-08 MED ORDER — DIBUCAINE (PERIANAL) 1 % EX OINT: 1.0000 "application " | TOPICAL_OINTMENT | CUTANEOUS | Status: DC | PRN

## 2020-06-08 MED ORDER — SENNOSIDES-DOCUSATE SODIUM 8.6-50 MG PO TABS
2.0000 | ORAL_TABLET | ORAL | Status: DC
  Administered 2020-06-08 – 2020-06-09 (×2): 2 via ORAL
  Filled 2020-06-08 (×3): qty 2

## 2020-06-08 MED ORDER — IBUPROFEN 600 MG PO TABS
600.0000 mg | ORAL_TABLET | Freq: Four times a day (QID) | ORAL | Status: DC
  Administered 2020-06-08 – 2020-06-09 (×6): 600 mg via ORAL
  Filled 2020-06-08 (×6): qty 1

## 2020-06-08 MED ORDER — COCONUT OIL OIL: 1.0000 "application " | TOPICAL_OIL | Status: DC | PRN

## 2020-06-08 MED ORDER — BENZOCAINE-MENTHOL 20-0.5 % EX AERO
1.0000 "application " | INHALATION_SPRAY | CUTANEOUS | Status: DC | PRN
  Filled 2020-06-08: qty 56

## 2020-06-08 MED ORDER — TETANUS-DIPHTH-ACELL PERTUSSIS 5-2.5-18.5 LF-MCG/0.5 IM SUSY: 0.5000 mL | PREFILLED_SYRINGE | Freq: Once | INTRAMUSCULAR | Status: DC

## 2020-06-08 MED ORDER — DIPHENHYDRAMINE HCL 25 MG PO CAPS: 25.0000 mg | ORAL_CAPSULE | Freq: Four times a day (QID) | ORAL | Status: DC | PRN

## 2020-06-08 MED ORDER — OXYTOCIN 10 UNIT/ML IJ SOLN
INTRAMUSCULAR | Status: AC
  Administered 2020-06-08: 10 [IU]
  Filled 2020-06-08: qty 1

## 2020-06-08 MED ORDER — ACETAMINOPHEN 325 MG PO TABS
650.0000 mg | ORAL_TABLET | ORAL | Status: DC | PRN
  Administered 2020-06-08 (×2): 650 mg via ORAL
  Filled 2020-06-08 (×2): qty 2

## 2020-06-08 NOTE — H&P (Signed)
OB ADMISSION/ HISTORY & PHYSICAL:  Admission Date: 06/07/2020  4:24 PM  Admit Diagnosis: Normal labor  Cynthia Mcdowell is a 30 y.o. female [phone] [redacted]w[redacted]d presenting for labor eval. Endorses active FM, denies LOF and vaginal bleeding. Ctx began @ 1300 and increased in intensity and frequency throughout the day. 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 Pt denies ever experiencing an outbreak and declined suppressive therapy this pregnancy despite counseling on risks and benefits. She denies any prodromal symptoms.   History of current pregnancy: [id number]   Patient entered care with [facility] at 7+3 wks.   EDC 06/29/20 by [place] @ 8 wks   Anatomy scan:  21 wks, complete w/ posterior placenta and right and left ventricular wall echogenicity.    Last evaluation: 37 wks vertex/ posterior placenta/AFI 13.6/EFW 8# 3 oz (89%) Significant prenatal events:  Patient Active Problem List   Diagnosis Date Noted  . SVD (5/12) 06/08/2020    Priority: Medium  . Indication for care in labor or delivery 06/07/2020  . Leukopenia 03/30/2017  . Asthma 09/19/2010  . Environmental allergies 09/19/2010  . Eczema 09/19/2010  . History of left salpingo-oophorectomy 03/1999    Prenatal Labs: ABO, Rh: --/--/B POS (05/12 1717) Antibody: NEG (05/12 1717) Rubella: Immune (10/27 0000)  RPR: Nonreactive (10/27 0000)  HBsAg: Negative (10/27 0000)  HIV: Non-reactive (10/27 0000)  GTT: passed 1 hr GBS: Negative/-- (04/21 0000)  GC/CHL: neg/neg Genetics: low-risk female Tdap/influenza vaccines: declined both   OB History  Gravida Para Term Preterm AB Living  5 3 2 1 1 3   SAB IAB Ectopic Multiple Live Births      1 0 3    # Outcome Date GA Lbr Len/2nd Weight Sex Delivery Anes PTL Lv  5 Current           4 Preterm 06/04/19 [redacted]w[redacted]d 01:46 / 00:02 2671 g F Vag-Spont None  LIV     Birth Comments: WNL  3 Term 04/18/18 [redacted]w[redacted]d 03:00 / 00:04 3515 g F Vag-Spont None  LIV  2 Ectopic 2019          1 Term  06/24/11 [redacted]w[redacted]d 03:36 / 00:02 3209 g F Vag-Spont Local  LIV    Medical / Surgical History: Past medical history:  Past Medical History:  Diagnosis Date  . Allergy    seasonal, food  . Asthma    last used inhaler a while ago  . Chlamydia contact, treated    Age 57   . Constipation 08/2008  . H/O candidiasis   . H/O varicella   . Hx of chlamydia infection    2010  . Irregular periods/menstrual cycles 08/10/2011    Past surgical history:  Past Surgical History:  Procedure Laterality Date  . LAPAROSCOPY N/A 03/30/2017   Procedure: LAPAROSCOPY OPERATIVE WITH LEFT SALPINGECTOMY;  Surgeon: 05/30/2017, MD;  Location: [facility];  Service: Gynecology;  Laterality: N/A;   Family History:  Family History  Problem Relation Age of Onset  . Breast cancer Maternal Grandmother   . Cancer Maternal Grandmother   . Hypertension Mother   . Hyperlipidemia Father   . Heart disease Maternal Uncle   . Kidney disease Paternal Grandmother   . Anesthesia problems Neg Hx   . Hypotension Neg Hx   . Malignant hyperthermia Neg Hx   . Pseudochol deficiency Neg Hx     Social History:  reports that she has never smoked. She has never used smokeless tobacco.  She reports that she does not drink alcohol and does not use drugs.  Allergies: Peanut oil, Peanut-containing drug products, Shellfish allergy, and Latex   Current Medications at time of admission:  Prior to Admission medications   Medication Sig Start Date End Date Taking? Authorizing Provider  ibuprofen (ADVIL) 600 MG tablet Take 1 tablet (600 mg total) by mouth every 6 (six) hours as needed. 09/24/19   Jacalyn Lefevre, MD  predniSONE (STERAPRED UNI-PAK 21 TAB) 10 MG (21) TBPK tablet Take 6 tabs for 2 days, then 5 for 2 days, then 4 for 2 days, then 3 for 2 days, 2 for 2 days, then 1 for 2 days 09/24/19   Jacalyn Lefevre, MD    Review of Systems: Constitutional: Negative   HENT: Negative   Eyes: Negative   Respiratory: Negative    Cardiovascular: Negative   Gastrointestinal: Negative  Genitourinary: neg for bloody show, neg for LOF   Musculoskeletal: Negative   Skin: Negative   Neurological: Negative   Endo/Heme/Allergies: Negative   Psychiatric/Behavioral: Negative    Physical Exam: VS: Blood pressure (!) 90/57, pulse 93, temperature 98.3 F (36.8 C), temperature source Oral, resp. rate 16, height 5\' 3"  (1.6 m), weight 75.3 kg, last menstrual period 08/24/2019, SpO2 99 %, unknown if currently breastfeeding. AAO x3, no signs of distress Cardiovascular: RRR Respiratory: Lung fields clear to ausculation GU/GI: Abdomen gravid, non-tender, non-distended, active FM, vertex, EFW 7.5# per Leopold's Extremities: no edema, negative for pain, tenderness, and cords  Cervical exam:Dilation: 7 Effacement (%): 80 Station: -2 Exam by:: [email or web address] FHR: baseline rate 140 / variability moderate / accelerations present / absent decelerations TOCO: 3-5   Prenatal Transfer Tool  Maternal Diabetes: No Genetic Screening: Normal Maternal Ultrasounds/Referrals: Isolated EIF (echogenic intracardiac focus) Fetal Ultrasounds or other Referrals:  None Maternal Substance Abuse:  No Significant Maternal Medications:  None Significant Maternal Lab Results: Group B Strep negative and Other: HSV positive, declined suppressive therapy    Assessment: 30 y.o. 37 [redacted]w[redacted]d  Active stage of labor Hx HSV    -no prodroma symptoms    -declined suppressive therapy    -negative spec exam FHR category 1 GBS negative Pain management plan: per pt's request, prev births were unmedicated, pt plans another unmedicated birth   Plan:  Admit to L&D Routine admission orders Epidural PRN  Dr [redacted]w[redacted]d notified of admission and plan of care  Normand Sloop MSN, CNM 06/07/2020 11:27 PM

## 2020-06-08 NOTE — Progress Notes (Signed)
PPD# 1 SVD w/ intact perineum Information for the patient's newborn:  Cynthia Mcdowell, Cynthia Mcdowell [[id number]]  female    S:   Reports feeling "really good" Tolerating PO fluid and solids No nausea or vomiting Bleeding is moderate Pain controlled with acetaminophen and ibuprofen (OTC) Up ad lib / ambulatory / voiding w/o difficulty Feeding: Breast    O:   VS: BP 107/76 (BP Location: Left Arm)   Pulse 84   Temp (!) 97.4 F (36.3 C) (Axillary) Comment: Pt eating ice and has no gown on at this time.  Resp 16   Ht 5\' 3"  (1.6 m)   Wt 75.3 kg   LMP 08/24/2019   SpO2 99%   Breastfeeding Unknown   BMI 29.41 kg/m   LABS:  Recent Labs    06/07/20 1712 06/08/20 0505  WBC 4.9 8.7  HGB 11.5* 10.6*  PLT 167 147*   Blood type: --/--/B POS (05/12 1717) Rubella: Immune (10/27 0000)                      I&O: Intake/Output      05/12 0701 05/13 0700 05/13 0701 05/14 0700   Blood 100    Total Output 100    Net -100           Physical Exam: Alert and oriented X3 Lungs: Clear and unlabored Heart: regular rate and rhythm / no mumurs Abdomen: soft, non-tender, non-distended  Fundus: firm, non-tender, U/1 Perineum: intact Lochia: minimal Extremities: negative edema, no calf pain or tenderness    A:  PPD # 1  Normal exam  P:  Routine post partum orders  Anticipate D/C on 06/09/20   Plan reviewed w/ Dr. 06/11/20, MSN, CNM 06/08/2020, 9:30 AM

## 2020-06-09 DIAGNOSIS — R7689 Other specified abnormal immunological findings in serum: Secondary | ICD-10-CM | POA: Diagnosis present

## 2020-06-09 DIAGNOSIS — R768 Other specified abnormal immunological findings in serum: Secondary | ICD-10-CM | POA: Diagnosis present

## 2020-06-09 MED ORDER — IBUPROFEN 600 MG PO TABS: 600.0000 mg | ORAL_TABLET | Freq: Four times a day (QID) | ORAL | 0 refills | Status: DC

## 2020-06-09 NOTE — Progress Notes (Signed)
Patient informed RN of knot in antecubital space of left arm where labs were drawn. RN gave patient ice that was wrapped and advised her to ice for 15 minutes and then remove for 15 minutes. Information given to dayshift RN.

## 2020-06-09 NOTE — Discharge Summary (Signed)
SVD OB Discharge Summary     Patient Name: Cynthia Mcdowell DOB: 08-Aug-1990 MRN: [id number]  Date of admission: 06/07/2020 Delivering MD: Rhea Pink B  Date of delivery: 06/07/2020 Type of delivery: SVD  Newborn Data: Sex: Baby female Live born female  Birth Weight: 7 lb 12 oz (3515 g) APGAR: 8, 9  Newborn Delivery   Birth date/time: 06/07/2020 23:48:00 Delivery type: Vaginal, Spontaneous      Feeding: breast Infant being discharge to home with mother in stable condition.   Admitting diagnosis: Normal labor and delivery [O80] Intrauterine pregnancy: [redacted]w[redacted]d     Secondary diagnosis:  Active Problems:   SVD (5/12)   History of left salpingo-oophorectomy   Normal labor and delivery   Normal postpartum course   HSV-2 seropositive                                Complications: None                                                              Intrapartum Procedures: spontaneous vaginal delivery Postpartum Procedures: none Complications-Operative and Postpartum: none Augmentation: AROM   History of Present Illness: Ms. Cynthia Mcdowell is a 30 y.o. female, [id number], who presents at [redacted]w[redacted]d weeks gestation. The patient has been followed at  [facility]  Her pregnancy has been complicated by:  Patient Active Problem List   Diagnosis Date Noted  . Normal labor and delivery 06/09/2020  . Normal postpartum course 06/09/2020  . HSV-2 seropositive 06/09/2020  . SVD (5/12) 06/08/2020  . Asthma 09/19/2010  . Eczema 09/19/2010  . History of left salpingo-oophorectomy 03/1999     Active Ambulatory Problems    Diagnosis Date Noted  . Asthma 09/19/2010  . Eczema 09/19/2010   Resolved Ambulatory Problems    Diagnosis Date Noted  . Environmental allergies 09/19/2010  . Back pain 09/19/2010  . Contraception management 09/19/2010  . Constipation 09/19/2010  . Pregnant state, incidental 05/23/2011  . Vaginal delivery 06/24/2011  . Irregular  periods/menstrual cycles   . Hx of chlamydia infection   . Leukopenia 03/30/2017  . Tubal ectopic pregnancy 03/30/2017  . Normal labor and delivery 04/18/2018  . SVD (spontaneous vaginal delivery) 04/18/2018  . Normal postpartum course 04/19/2018  . Indication for care in labor or delivery 06/04/2019   Past Medical History:  Diagnosis Date  . Allergy   . Asthma   . Chlamydia contact, treated   . H/O candidiasis   . H/O varicella      Hospital course:  Onset of Labor With Vaginal Delivery      30 y.o. yo [id number] at [redacted]w[redacted]d was admitted in Active Labor on 06/07/2020. Patient had an uncomplicated labor course as follows:  Membrane Rupture Time/Date: 10:59 PM ,06/07/2020   Delivery Method:Vaginal, Spontaneous  Episiotomy: None  Lacerations:  None  Patient had an uncomplicated postpartum course.  She is ambulating, tolerating a regular diet, passing flatus, and urinating well. Patient is discharged home in stable condition on 06/09/20.  Newborn Data: Birth date:06/07/2020  Birth time:11:48 PM  Gender:Female  Living status:Living  Apgars:8 ,9  Weight:3515 g  Postpartum Day # 2 : S/P NSVD due to admitted on on  5/12 for active spontaneous labor at 39.1 weeks, progressed with AROM to SVD on 5/12 at 2348 over intact perineum, EBL was , hgb drop of 11.5-10.6, asymptomatic and stable.  H/O HSV+, no lesion not on valtrex, asthma no meds. Patient up ad lib, denies syncope or dizziness. Reports consuming regular diet without issues and denies N/V. Patient reports 0 bowel movement + passing flatus.  Denies issues with urination and reports bleeding is "lighter."  Patient is breastfeeding and reports going well.  Desires nexplanon for postpartum contraception.  Pain is being appropriately managed with use of po meds.   Physical exam  Vitals:   06/08/20 1145 06/08/20 1545 06/09/20 0036 06/09/20 0549  BP: 106/80 (!) 99/58 98/67 114/75  Pulse: 89 79 95 89  Resp: 16 17 16 15   Temp: 97.6 F (36.4  C) 98.5 F (36.9 C) (!) 97.3 F (36.3 C) 99 F (37.2 C)  TempSrc: Oral Oral Oral Oral  SpO2: 98% 97% 99% 100%  Weight:      Height:       General: alert, cooperative and no distress Lochia: appropriate Uterine Fundus: firm Incision: Healing well with no significant drainage, No significant erythema, Dressing is clean, dry, and intact, honeycomb dressing CDI Perineum: intact DVT Evaluation: No evidence of DVT seen on physical exam. Negative Homan's sign. No cords or calf tenderness. No significant calf/ankle edema.  Labs: Lab Results  Component Value Date   WBC 8.7 06/08/2020   HGB 10.6 (L) 06/08/2020   HCT 31.3 (L) 06/08/2020   MCV 86.7 06/08/2020   PLT 147 (L) 06/08/2020   CMP Latest Ref Rng & Units 04/16/2019  Glucose 70 - 99 mg/dL 84  BUN 6 - 20 mg/dL 04/18/2019)  Creatinine <0(J - 1.00 mg/dL 8.11  Sodium 9.14 - 782 mmol/L 137  Potassium 3.5 - 5.1 mmol/L 3.7  Chloride 98 - 111 mmol/L 103  CO2 22 - 32 mmol/L 24  Calcium 8.9 - 10.3 mg/dL 956)  Total Protein 6.5 - 8.1 g/dL 6.9  Total Bilirubin 0.3 - 1.2 mg/dL 0.9  Alkaline Phos 38 - 126 U/L 138(H)  AST 15 - 41 U/L 22  ALT 0 - 44 U/L 11    Date of discharge: 06/09/2020 Discharge Diagnoses: Term Pregnancy-delivered Discharge instruction: per After Visit Summary and "Baby and Me Booklet".  After visit meds:   Activity:           unrestricted and pelvic rest Advance as tolerated. Pelvic rest for 6 weeks.  Diet:                routine Medications: PNV and Ibuprofen Postpartum contraception: Nexplanon and at 6 weeks PP Condition:  Pt discharge to home with baby in stable and condition   Meds: Allergies as of 06/09/2020      Reactions   Peanut Oil Anaphylaxis   Peanut-containing Drug Products Anaphylaxis   Allergic to all nuts   Shellfish Allergy Anaphylaxis   Latex Itching      Medication List    STOP taking these medications   predniSONE 10 MG (21) Tbpk tablet Commonly known as: STERAPRED UNI-PAK 21 TAB      TAKE these medications   ibuprofen 600 MG tablet Commonly known as: ADVIL Take 1 tablet (600 mg total) by mouth every 6 (six) hours. What changed:   when to take this  reasons to take this       Discharge Follow Up:   Follow-up Information    [facility].  Schedule an appointment as soon as possible for a visit in 6 week(s).   Specialty: Obstetrics and Gynecology Contact information: [address] [place] [address] [phone]               [place], NP-C, CNM 06/09/2020, 1:43 PM  Dale [place], FNP

## 2020-06-27 DIAGNOSIS — Z419 Encounter for procedure for purposes other than remedying health state, unspecified: Secondary | ICD-10-CM | POA: Diagnosis not present

## 2020-07-27 DIAGNOSIS — Z419 Encounter for procedure for purposes other than remedying health state, unspecified: Secondary | ICD-10-CM | POA: Diagnosis not present

## 2020-08-03 DIAGNOSIS — Z3040 Encounter for surveillance of contraceptives, unspecified: Secondary | ICD-10-CM | POA: Diagnosis not present

## 2020-08-03 DIAGNOSIS — M5459 Other low back pain: Secondary | ICD-10-CM | POA: Diagnosis not present

## 2020-08-27 DIAGNOSIS — Z419 Encounter for procedure for purposes other than remedying health state, unspecified: Secondary | ICD-10-CM | POA: Diagnosis not present

## 2020-09-26 DIAGNOSIS — Z30017 Encounter for initial prescription of implantable subdermal contraceptive: Secondary | ICD-10-CM | POA: Diagnosis not present

## 2020-09-27 DIAGNOSIS — Z419 Encounter for procedure for purposes other than remedying health state, unspecified: Secondary | ICD-10-CM | POA: Diagnosis not present

## 2020-10-27 DIAGNOSIS — Z419 Encounter for procedure for purposes other than remedying health state, unspecified: Secondary | ICD-10-CM | POA: Diagnosis not present

## 2020-11-27 DIAGNOSIS — Z419 Encounter for procedure for purposes other than remedying health state, unspecified: Secondary | ICD-10-CM | POA: Diagnosis not present

## 2020-12-27 ENCOUNTER — Other Ambulatory Visit (HOSPITAL_BASED_OUTPATIENT_CLINIC_OR_DEPARTMENT_OTHER): Payer: Self-pay

## 2020-12-27 DIAGNOSIS — Z419 Encounter for procedure for purposes other than remedying health state, unspecified: Secondary | ICD-10-CM | POA: Diagnosis not present

## 2021-01-27 DIAGNOSIS — Z419 Encounter for procedure for purposes other than remedying health state, unspecified: Secondary | ICD-10-CM | POA: Diagnosis not present

## 2021-02-27 DIAGNOSIS — Z419 Encounter for procedure for purposes other than remedying health state, unspecified: Secondary | ICD-10-CM | POA: Diagnosis not present

## 2021-03-27 DIAGNOSIS — Z419 Encounter for procedure for purposes other than remedying health state, unspecified: Secondary | ICD-10-CM | POA: Diagnosis not present

## 2021-04-27 DIAGNOSIS — Z419 Encounter for procedure for purposes other than remedying health state, unspecified: Secondary | ICD-10-CM | POA: Diagnosis not present

## 2021-05-27 DIAGNOSIS — Z419 Encounter for procedure for purposes other than remedying health state, unspecified: Secondary | ICD-10-CM | POA: Diagnosis not present

## 2021-06-27 DIAGNOSIS — Z419 Encounter for procedure for purposes other than remedying health state, unspecified: Secondary | ICD-10-CM | POA: Diagnosis not present

## 2021-07-27 DIAGNOSIS — Z419 Encounter for procedure for purposes other than remedying health state, unspecified: Secondary | ICD-10-CM | POA: Diagnosis not present

## 2021-08-13 DIAGNOSIS — Z3046 Encounter for surveillance of implantable subdermal contraceptive: Secondary | ICD-10-CM | POA: Diagnosis not present

## 2021-08-27 DIAGNOSIS — Z419 Encounter for procedure for purposes other than remedying health state, unspecified: Secondary | ICD-10-CM | POA: Diagnosis not present

## 2021-09-27 DIAGNOSIS — Z419 Encounter for procedure for purposes other than remedying health state, unspecified: Secondary | ICD-10-CM | POA: Diagnosis not present

## 2021-10-27 DIAGNOSIS — Z419 Encounter for procedure for purposes other than remedying health state, unspecified: Secondary | ICD-10-CM | POA: Diagnosis not present

## 2021-11-27 DIAGNOSIS — Z419 Encounter for procedure for purposes other than remedying health state, unspecified: Secondary | ICD-10-CM | POA: Diagnosis not present

## 2021-12-27 DIAGNOSIS — Z419 Encounter for procedure for purposes other than remedying health state, unspecified: Secondary | ICD-10-CM | POA: Diagnosis not present

## 2022-01-03 DIAGNOSIS — Z0001 Encounter for general adult medical examination with abnormal findings: Secondary | ICD-10-CM | POA: Diagnosis not present

## 2022-01-03 DIAGNOSIS — Z124 Encounter for screening for malignant neoplasm of cervix: Secondary | ICD-10-CM | POA: Diagnosis not present

## 2022-01-03 DIAGNOSIS — Z6824 Body mass index (BMI) 24.0-24.9, adult: Secondary | ICD-10-CM | POA: Diagnosis not present

## 2022-01-03 DIAGNOSIS — Z113 Encounter for screening for infections with a predominantly sexual mode of transmission: Secondary | ICD-10-CM | POA: Diagnosis not present

## 2022-01-03 DIAGNOSIS — Z0000 Encounter for general adult medical examination without abnormal findings: Secondary | ICD-10-CM | POA: Diagnosis not present

## 2022-01-27 DIAGNOSIS — Z419 Encounter for procedure for purposes other than remedying health state, unspecified: Secondary | ICD-10-CM | POA: Diagnosis not present

## 2022-02-12 ENCOUNTER — Encounter (HOSPITAL_COMMUNITY): Payer: Self-pay | Admitting: Emergency Medicine

## 2022-02-12 ENCOUNTER — Emergency Department (HOSPITAL_COMMUNITY): Payer: Medicaid Other

## 2022-02-12 ENCOUNTER — Emergency Department (HOSPITAL_COMMUNITY)
Admission: EM | Admit: 2022-02-12 | Discharge: 2022-02-12 | Disposition: A | Discharge status: Home / Self Care | Payer: Medicaid Other | Attending: Emergency Medicine | Admitting: Emergency Medicine

## 2022-02-12 ENCOUNTER — Other Ambulatory Visit: Payer: Self-pay

## 2022-02-12 DIAGNOSIS — R1084 Generalized abdominal pain: Secondary | ICD-10-CM | POA: Diagnosis not present

## 2022-02-12 DIAGNOSIS — R109 Unspecified abdominal pain: Secondary | ICD-10-CM | POA: Diagnosis not present

## 2022-02-12 DIAGNOSIS — Z1152 Encounter for screening for COVID-19: Secondary | ICD-10-CM | POA: Diagnosis not present

## 2022-02-12 DIAGNOSIS — Z91010 Allergy to peanuts: Secondary | ICD-10-CM | POA: Diagnosis not present

## 2022-02-12 DIAGNOSIS — R112 Nausea with vomiting, unspecified: Secondary | ICD-10-CM | POA: Insufficient documentation

## 2022-02-12 DIAGNOSIS — Z91040 Latex allergy status: Secondary | ICD-10-CM | POA: Insufficient documentation

## 2022-02-12 DIAGNOSIS — R9431 Abnormal electrocardiogram [ECG] [EKG]: Secondary | ICD-10-CM | POA: Diagnosis not present

## 2022-02-12 DIAGNOSIS — J45909 Unspecified asthma, uncomplicated: Secondary | ICD-10-CM | POA: Insufficient documentation

## 2022-02-12 LAB — RESP PANEL BY RT-PCR (RSV, FLU A&B, COVID)  RVPGX2
Influenza A by PCR: NEGATIVE
Influenza B by PCR: NEGATIVE
Resp Syncytial Virus by PCR: NEGATIVE
SARS Coronavirus 2 by RT PCR: NEGATIVE

## 2022-02-12 LAB — COMPREHENSIVE METABOLIC PANEL
ALT: 17 U/L (ref 0–44)
AST: 24 U/L (ref 15–41)
Albumin: 4.2 g/dL (ref 3.5–5.0)
Alkaline Phosphatase: 100 U/L (ref 38–126)
Anion gap: 8 (ref 5–15)
BUN: 11 mg/dL (ref 6–20)
CO2: 28 mmol/L (ref 22–32)
Calcium: 9.2 mg/dL (ref 8.9–10.3)
Chloride: 103 mmol/L (ref 98–111)
Creatinine, Ser: 0.64 mg/dL (ref 0.44–1.00)
GFR, Estimated: >60 mL/min (ref >60)
Glucose, Bld: 115 mg/dL — ABNORMAL HIGH (ref 70–99)
Potassium: 3.9 mmol/L (ref 3.5–5.1)
Sodium: 139 mmol/L (ref 135–145)
Total Bilirubin: 0.5 mg/dL (ref 0.3–1.2)
Total Protein: 7.6 g/dL (ref 6.5–8.1)

## 2022-02-12 LAB — CBC WITH DIFFERENTIAL/PLATELET
Abs Immature Granulocytes: 0.01 10*3/uL (ref 0.00–0.07)
Basophils Absolute: 0 10*3/uL (ref 0.0–0.1)
Basophils Relative: 0 %
Eosinophils Absolute: 0 10*3/uL (ref 0.0–0.5)
Eosinophils Relative: 0 %
HCT: 41.3 % (ref 36.0–46.0)
Hemoglobin: 14.3 g/dL (ref 12.0–15.0)
Immature Granulocytes: 0 %
Lymphocytes Relative: 4 %
Lymphs Abs: 0.3 10*3/uL — ABNORMAL LOW (ref 0.7–4.0)
MCH: 31 pg (ref 26.0–34.0)
MCHC: 34.6 g/dL (ref 30.0–36.0)
MCV: 89.6 fL (ref 80.0–100.0)
Monocytes Absolute: 0.3 10*3/uL (ref 0.1–1.0)
Monocytes Relative: 4 %
Neutro Abs: 7.6 10*3/uL (ref 1.7–7.7)
Neutrophils Relative %: 92 %
Platelets: 174 10*3/uL (ref 150–400)
RBC: 4.61 MIL/uL (ref 3.87–5.11)
RDW: 12.3 % (ref 11.5–15.5)
WBC: 8.2 10*3/uL (ref 4.0–10.5)
nRBC: 0 % (ref 0.0–0.2)

## 2022-02-12 LAB — LIPASE, BLOOD: Lipase: 48 U/L (ref 11–51)

## 2022-02-12 LAB — HCG, QUANTITATIVE, PREGNANCY: hCG, Beta Chain, Quant, S: 1 m[IU]/mL (ref <5)

## 2022-02-12 MED ORDER — ONDANSETRON 4 MG PO TBDP: 4.0000 mg | ORAL_TABLET | Freq: Three times a day (TID) | ORAL | 0 refills | Status: DC | PRN

## 2022-02-12 MED ORDER — SODIUM CHLORIDE 0.9 % IV BOLUS
1000.0000 mL | Freq: Once | INTRAVENOUS | Status: AC
  Administered 2022-02-12: 1000 mL via INTRAVENOUS

## 2022-02-12 MED ORDER — IOHEXOL 350 MG/ML SOLN
75.0000 mL | Freq: Once | INTRAVENOUS | Status: AC | PRN
  Administered 2022-02-12: 75 mL via INTRAVENOUS

## 2022-02-12 NOTE — ED Notes (Signed)
Patient Alert and oriented to baseline. Stable and ambulatory to baseline. Patient verbalized understanding of the discharge instructions.  Patient belongings were taken by the patient.   

## 2022-02-12 NOTE — ED Notes (Signed)
Pt given water with no issues.

## 2022-02-12 NOTE — ED Provider Triage Note (Signed)
Emergency Medicine Provider Triage Evaluation Note  Cynthia Mcdowell , a 32 y.o. female  was evaluated in triage.  Pt complains of abdominal pain started today states is generalized, associated nausea and vomiting still passing gas having normal bowel movements, denies bloody emesis coffee-ground emesis denies bloody stools or melena denies any urinary symptoms denies any vaginal discharge vaginal bleeding, she is on birth control, she believes that she is not pregnant, she has had her right fallopian tube removed and ectopic pregnancy.  Just noticed some chest pain but feels it likely coming from the vomiting.  She has no cardiac history..  Review of Systems  Positive: Abdominal pain nausea vomiting Negative: Chest pain, shortness of breath  Physical Exam  BP (!) 96/57 (BP Location: Left Arm)   Pulse 77   Temp 97.8 F (36.6 C) (Oral)   Resp 16   SpO2 100%  Gen:   Awake, no distress   Resp:  Normal effort  MSK:   Moves extremities without difficulty  Other:    Medical Decision Making  Medically screening exam initiated at 5:52 AM.  Appropriate orders placed.  ELAF CLAUSON was informed that the remainder of the evaluation will be completed by another provider, this initial triage assessment does not replace that evaluation, and the importance of remaining in the ED until their evaluation is complete.  Lab work imaging ordered will need further workup.   Marcello Fennel, PA-C 02/12/22 [phone]

## 2022-02-12 NOTE — Discharge Instructions (Signed)
Home to rest and hydrate. Zofran as needed as directed. Recheck with your primary care provider if symptoms return or persist. Return to the ER for worsening or concerning symptoms.

## 2022-02-12 NOTE — ED Provider Notes (Signed)
[name] Provider Note   CSN: [id number] Arrival date & time: 02/12/22  0541     History  Chief Complaint  Patient presents with   Abdominal Pain   Chest Pain    Cynthia Mcdowell is a 32 y.o. female.  32 year old female with complaint of vomiting today onset while at work at [employer] with diffuse abdominal pain. Vomiting and abdominal pain have since resolved, has lower back discomfort at this time. Denies blood in emesis or stools. Feels light headed with sitting up, reports normally low BP readings.        Home Medications Prior to Admission medications   Medication Sig Start Date End Date Taking? Authorizing Provider  ondansetron (ZOFRAN-ODT) 4 MG disintegrating tablet Take 1 tablet (4 mg total) by mouth every 8 (eight) hours as needed for nausea or vomiting. 02/12/22  Yes Tacy Learn, PA-C  ibuprofen (ADVIL) 600 MG tablet Take 1 tablet (600 mg total) by mouth every 6 (six) hours. 06/09/20   Noralyn Pick, FNP      Allergies    Peanut oil, Peanut-containing drug products, Shellfish allergy, and Latex    Review of Systems   Review of Systems Negative except as per HPI Physical Exam Updated Vital Signs BP 109/64 (BP Location: Left Arm)   Pulse 90   Temp 99 F (37.2 C) (Oral)   Resp 17   SpO2 100%  Physical Exam Vitals and nursing note reviewed.  Constitutional:      General: She is not in acute distress.    Appearance: She is well-developed. She is not diaphoretic.  HENT:     Head: Normocephalic and atraumatic.  Cardiovascular:     Rate and Rhythm: Normal rate and regular rhythm.     Heart sounds: Normal heart sounds.  Pulmonary:     Effort: Pulmonary effort is normal.     Breath sounds: Normal breath sounds.  Abdominal:     Palpations: Abdomen is soft.     Tenderness: There is no abdominal tenderness. There is no right CVA tenderness or left CVA tenderness.  Skin:    General: Skin is warm and dry.     Findings: No  erythema or rash.  Neurological:     Mental Status: She is alert and oriented to person, place, and time.  Psychiatric:        Behavior: Behavior normal.     ED Results / Procedures / Treatments   Labs (all labs ordered are listed, but only abnormal results are displayed) Labs Reviewed  COMPREHENSIVE METABOLIC PANEL - Abnormal; Notable for the following components:      Result Value   Glucose, Bld 115 (*)    All other components within normal limits  CBC WITH DIFFERENTIAL/PLATELET - Abnormal; Notable for the following components:   Lymphs Abs 0.3 (*)    All other components within normal limits  RESP PANEL BY RT-PCR (RSV, FLU A&B, COVID)  RVPGX2  LIPASE, BLOOD  HCG, QUANTITATIVE, PREGNANCY  URINALYSIS, ROUTINE W REFLEX MICROSCOPIC    EKG None  Radiology CT ABDOMEN PELVIS W CONTRAST  Result Date: 02/12/2022 CLINICAL DATA:  Abdominal pain, acute, nonlocalized EXAM: CT ABDOMEN AND PELVIS WITH CONTRAST TECHNIQUE: Multidetector CT imaging of the abdomen and pelvis was performed using the standard protocol following bolus administration of intravenous contrast. RADIATION DOSE REDUCTION: This exam was performed according to the departmental dose-optimization program which includes automated exposure control, adjustment of the mA and/or kV according to patient  size and/or use of iterative reconstruction technique. CONTRAST:  80mL OMNIPAQUE IOHEXOL 350 MG/ML SOLN COMPARISON:  None Available. FINDINGS: Lower chest: No acute abnormality. Hepatobiliary: No focal liver abnormality is seen. No gallstones, gallbladder wall thickening, or biliary dilatation. Pancreas: Unremarkable. No pancreatic ductal dilatation or surrounding inflammatory changes. Spleen: Normal in size without focal abnormality. Adrenals/Urinary Tract: Adrenal glands are unremarkable. Kidneys are normal, without renal calculi, focal lesion, or hydronephrosis. Bladder is unremarkable. Stomach/Bowel: Stomach is within normal limits.  Generalized thickening of the small bowel loops with mucosal enhancement, which may represent enteritis in appropriate clinical settings. Appendix appears normal. Colon is unremarkable. Vascular/Lymphatic: No significant vascular findings are present. No enlarged abdominal or pelvic lymph nodes. Reproductive: Uterus and bilateral adnexa are unremarkable. Other: No abdominal wall hernia or abnormality. No abdominopelvic ascites. Musculoskeletal: No acute or significant osseous findings. IMPRESSION: 1. Generalized thickening of the small bowel loops with mucosal enhancement, which may represent enteritis in appropriate clinical settings. No evidence of bowel obstruction. 2. Normal appendix. No evidence of colitis or diverticulitis. 3. No evidence of nephrolithiasis or hydronephrosis. Electronically Signed   By: Keane Police D.O.   On: 02/12/2022 15:28    Procedures Procedures    Medications Ordered in ED Medications  iohexol (OMNIPAQUE) 350 MG/ML injection 75 mL (75 mLs Intravenous Contrast Given 02/12/22 1511)  sodium chloride 0.9 % bolus 1,000 mL (0 mLs Intravenous Stopped 02/12/22 1901)    ED Course/ Medical Decision Making/ A&P                             Medical Decision Making Amount and/or Complexity of Data Reviewed Labs: ordered.  Risk Prescription drug management.   This patient presents to the ED for concern of vomiting and abdominal pain, resolved by time of evaluation, this involves an extensive number of treatment options, and is a complaint that carries with it a high risk of complications and morbidity.  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  The pertinent results include: RSV/flu/COVID-negative.  hCG negative.  Lipase within normal notes.  CMP without segment findings.  CBC unremarkable.   Imaging Studies ordered:  I ordered imaging studies including CT abdomen pelvis I independently visualized and interpreted imaging which showed negative for obstruction I agree with the radiologist interpretation, possible enteritis   Problem List / ED Course / Critical interventions / Medication management  32 year old female presents with complaint of vomiting abdominal pain onset roughly 15 hours prior to evaluation in the ER due to significant wait in the lobby tonight.  At time of exam, her symptoms have significantly improved/resolved.  She is no longer vomiting, her abdomen pain has resolved, has feeling of lightheaded with sitting up in bed.  Suspect this is secondary to all of her vomiting and now with prolonged wait in the ER and n.p.o. status.  She is provided with IV fluids, her symptoms resolved and she feels ready for discharge.  Discharged with prescription for Zofran. I ordered medication including IV fluids for feeling lightheaded with standing Reevaluation of the patient after these medicines showed that the patient resolved I have reviewed the patients home  medicines and have made adjustments as needed   Social Determinants of Health:  Has PCP   Test / Admission - Considered:  Improved with IV fluids, ready for discharge to follow-up with PCP with return to ER precautions.         Final Clinical Impression(s) / ED Diagnoses Final diagnoses:  Generalized abdominal pain  Nausea and vomiting, unspecified vomiting type    Rx / DC Orders ED Discharge Orders          Ordered    ondansetron (ZOFRAN-ODT) 4 MG disintegrating tablet  Every 8 hours PRN        02/12/22 1858              Jeannie Fend, PA-C 02/12/22 2253     Mardene Sayer, MD 02/13/22 1248

## 2022-02-12 NOTE — ED Triage Notes (Signed)
Pt reports chest and abdominal pain w/ n/v/d and chills reported.

## 2022-02-27 DIAGNOSIS — Z419 Encounter for procedure for purposes other than remedying health state, unspecified: Secondary | ICD-10-CM | POA: Diagnosis not present

## 2022-03-28 DIAGNOSIS — Z419 Encounter for procedure for purposes other than remedying health state, unspecified: Secondary | ICD-10-CM | POA: Diagnosis not present

## 2022-04-28 DIAGNOSIS — Z419 Encounter for procedure for purposes other than remedying health state, unspecified: Secondary | ICD-10-CM | POA: Diagnosis not present
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
## 2022-05-28 DIAGNOSIS — Z419 Encounter for procedure for purposes other than remedying health state, unspecified: Secondary | ICD-10-CM | POA: Diagnosis not present

## 2022-06-09 ENCOUNTER — Emergency Department (HOSPITAL_COMMUNITY)
Admission: EM | Admit: 2022-06-09 | Discharge: 2022-06-09 | Disposition: A | Discharge status: Home / Self Care | Payer: Medicaid Other | Attending: Emergency Medicine | Admitting: Emergency Medicine

## 2022-06-09 ENCOUNTER — Encounter (HOSPITAL_COMMUNITY): Payer: Self-pay

## 2022-06-09 DIAGNOSIS — Z91010 Allergy to peanuts: Secondary | ICD-10-CM | POA: Diagnosis not present

## 2022-06-09 DIAGNOSIS — Z91040 Latex allergy status: Secondary | ICD-10-CM | POA: Insufficient documentation

## 2022-06-09 DIAGNOSIS — R0789 Other chest pain: Secondary | ICD-10-CM | POA: Diagnosis not present

## 2022-06-09 DIAGNOSIS — Z743 Need for continuous supervision: Secondary | ICD-10-CM | POA: Diagnosis not present

## 2022-06-09 DIAGNOSIS — R112 Nausea with vomiting, unspecified: Secondary | ICD-10-CM | POA: Insufficient documentation

## 2022-06-09 DIAGNOSIS — R531 Weakness: Secondary | ICD-10-CM | POA: Diagnosis not present

## 2022-06-09 LAB — CBC WITH DIFFERENTIAL/PLATELET
Abs Immature Granulocytes: 0.01 10*3/uL (ref 0.00–0.07)
Basophils Absolute: 0 10*3/uL (ref 0.0–0.1)
Basophils Relative: 0 %
Eosinophils Absolute: 0.8 10*3/uL — ABNORMAL HIGH (ref 0.0–0.5)
Eosinophils Relative: 15 %
HCT: 37.9 % (ref 36.0–46.0)
Hemoglobin: 12.5 g/dL (ref 12.0–15.0)
Immature Granulocytes: 0 %
Lymphocytes Relative: 21 %
Lymphs Abs: 1 10*3/uL (ref 0.7–4.0)
MCH: 30.4 pg (ref 26.0–34.0)
MCHC: 33 g/dL (ref 30.0–36.0)
MCV: 92.2 fL (ref 80.0–100.0)
Monocytes Absolute: 0.4 10*3/uL (ref 0.1–1.0)
Monocytes Relative: 9 %
Neutro Abs: 2.8 10*3/uL (ref 1.7–7.7)
Neutrophils Relative %: 55 %
Platelets: 169 10*3/uL (ref 150–400)
RBC: 4.11 MIL/uL (ref 3.87–5.11)
RDW: 12 % (ref 11.5–15.5)
WBC: 5 10*3/uL (ref 4.0–10.5)
nRBC: 0 % (ref 0.0–0.2)

## 2022-06-09 LAB — COMPREHENSIVE METABOLIC PANEL
ALT: 30 U/L (ref 0–44)
AST: 56 U/L — ABNORMAL HIGH (ref 15–41)
Albumin: 3.5 g/dL (ref 3.5–5.0)
Alkaline Phosphatase: 96 U/L (ref 38–126)
Anion gap: 6 (ref 5–15)
BUN: 10 mg/dL (ref 6–20)
CO2: 25 mmol/L (ref 22–32)
Calcium: 8.7 mg/dL — ABNORMAL LOW (ref 8.9–10.3)
Chloride: 104 mmol/L (ref 98–111)
Creatinine, Ser: 0.82 mg/dL (ref 0.44–1.00)
GFR, Estimated: >60 mL/min (ref >60)
Glucose, Bld: 104 mg/dL — ABNORMAL HIGH (ref 70–99)
Potassium: 4.5 mmol/L (ref 3.5–5.1)
Sodium: 135 mmol/L (ref 135–145)
Total Bilirubin: 0.5 mg/dL (ref 0.3–1.2)
Total Protein: 6.4 g/dL — ABNORMAL LOW (ref 6.5–8.1)

## 2022-06-09 LAB — I-STAT BETA HCG BLOOD, ED (MC, WL, AP ONLY): I-stat hCG, quantitative: <5 m[IU]/mL (ref <5)

## 2022-06-09 LAB — LIPASE, BLOOD: Lipase: 61 U/L — ABNORMAL HIGH (ref 11–51)

## 2022-06-09 NOTE — Discharge Instructions (Addendum)
Lab workup and imaging were reassuring, I recommend a bland diet.  Follow-up with your PCP as needed  Come back to the emergency department if you develop chest pain, shortness of breath, severe abdominal pain, uncontrolled nausea, vomiting, diarrhea.
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
## 2022-06-09 NOTE — ED Provider Notes (Signed)
[place] EMERGENCY DEPARTMENT AT [facility] Provider Note   CSN: [id number] Arrival date & time: 06/09/22  0258     History  Chief Complaint  Patient presents with   Emesis    Cynthia Mcdowell is a 32 y.o. female.  HPI   Medical history including ovarian torsion, presenting with complaints of sudden onset of chest pain and vomiting.  Patient states that she was sleeping woke up had some chest pain and then vomited, states after the vomiting she feels much better, she states the chest pain was not chest pain was more like chest pressure, states it was in her epigastric region, she denies any pleuritic chest pain or shortness of breath, she has no cardiac history no history of PEs or DVTs no recent surgeries no long immobilization she is not on oral birth control.  Other than a ovarian torsion she has no other surgical history, she states she is still passing gas having normal bowel movements denies any urinary symptoms no vaginal discharge or vaginal bleeding.  She states she currently feels fine at this time.  She states that she is brought here because her blood pressure was low.    Home Medications Prior to Admission medications   Medication Sig Start Date End Date Taking? Authorizing Provider  ibuprofen (ADVIL) 600 MG tablet Take 1 tablet (600 mg total) by mouth every 6 (six) hours. 06/09/20   [place], Lesly Rubenstein, FNP  ondansetron (ZOFRAN-ODT) 4 MG disintegrating tablet Take 1 tablet (4 mg total) by mouth every 8 (eight) hours as needed for nausea or vomiting. 02/12/22   Jeannie Fend, PA-C      Allergies    Peanut oil, Peanut-containing drug products, Shellfish allergy, and Latex    Review of Systems   Review of Systems  Constitutional:  Negative for chills and fever.  Respiratory:  Negative for shortness of breath.   Cardiovascular:  Negative for chest pain.  Gastrointestinal:  Positive for nausea and vomiting. Negative for abdominal pain.  Neurological:  Negative  for headaches.    Physical Exam Updated Vital Signs BP 111/73   Pulse 65   Temp 97.6 F (36.4 C) (Oral)   Resp 12   Ht 5\' 3"  (1.6 m)   Wt 68 kg   LMP 06/02/2022   SpO2 98%   BMI 26.57 kg/m  Physical Exam Vitals and nursing note reviewed.  Constitutional:      General: She is not in acute distress.    Appearance: She is not ill-appearing.  HENT:     Head: Normocephalic and atraumatic.     Nose: No congestion.  Eyes:     Conjunctiva/sclera: Conjunctivae normal.  Cardiovascular:     Rate and Rhythm: Normal rate and regular rhythm.     Pulses: Normal pulses.     Heart sounds: No murmur heard.    No friction rub. No gallop.  Pulmonary:     Effort: No respiratory distress.     Breath sounds: No wheezing, rhonchi or rales.  Abdominal:     Palpations: Abdomen is soft.     Tenderness: There is no abdominal tenderness. There is no right CVA tenderness or left CVA tenderness.  Musculoskeletal:     Right lower leg: No edema.     Left lower leg: No edema.     Comments: No unilateral leg swelling no calf tenderness no palpable cords.  Skin:    General: Skin is warm and dry.  Neurological:     Mental  Status: She is alert.  Psychiatric:        Mood and Affect: Mood normal.     ED Results / Procedures / Treatments   Labs (all labs ordered are listed, but only abnormal results are displayed) Labs Reviewed  COMPREHENSIVE METABOLIC PANEL - Abnormal; Notable for the following components:      Result Value   Glucose, Bld 104 (*)    Calcium 8.7 (*)    Total Protein 6.4 (*)    AST 56 (*)    All other components within normal limits  LIPASE, BLOOD - Abnormal; Notable for the following components:   Lipase 61 (*)    All other components within normal limits  CBC WITH DIFFERENTIAL/PLATELET - Abnormal; Notable for the following components:   Eosinophils Absolute 0.8 (*)    All other components within normal limits  I-STAT BETA HCG BLOOD, ED ([facility], [facility], [facility] ONLY)     EKG None  Radiology No results found.  Procedures Procedures    Medications Ordered in ED Medications - No data to display  ED Course/ Medical Decision Making/ A&P                             Medical Decision Making Amount and/or Complexity of Data Reviewed Labs: ordered.   This patient presents to the ED for concern of vomiting, this involves an extensive number of treatment options, and is a complaint that carries with it a high risk of complications and morbidity.  The differential diagnosis includes bowel obstruction, volvulus, cholecystitis, pancreatitis    Additional history obtained:  Additional history obtained from N/A External records from outside source obtained and reviewed including recent ER note   Co morbidities that complicate the patient evaluation  N/a  Social Determinants of Health:  N/a    Lab Tests:  I Ordered, and personally interpreted labs.  The pertinent results include: CBC unremarkable hCG negative, CMP reveals a glucose of 104, calcium 8.7 AST 56, lipase 61   Imaging Studies ordered:  I ordered imaging studies including N/A I independently visualized and interpreted imaging which showed N/A I agree with the radiologist interpretation   Cardiac Monitoring:  The patient was maintained on a cardiac monitor.  I personally viewed and interpreted the cardiac monitored which showed an underlying rhythm of: Without signs of ischemia   Medicines ordered and prescription drug management:  I ordered medication including N/A I have reviewed the patients home medicines and have made adjustments as needed  Critical Interventions:  N/A   Reevaluation:  Presents with emesis, she states she has no complaints at this time, vital signs are stable, will obtain screening labs and reassess  Reassessment patient resting comfortably, abdomen remains soft nontender, patient is in agreement with discharge at this time  Consultations  Obtained:  N/a    Test Considered:  CT AP-deferred as she has a nonsurgical abdomen vital signs reassuring no leukocytosis.    Rule out low suspicion for lower lobe pneumonia as lung sounds are clear bilaterally, will defer imaging at this time.  I have low suspicion for liver or gallbladder abnormality as she has no right upper quadrant tenderness, , alk phos, T bili all within normal limits.  AST is very minimally elevated suspect this more transient.  Low suspicion for pancreatitis as lipase is within normal limits.  Low suspicion for ruptured stomach ulcer as she has no peritoneal sign present on exam.  Low suspicion for  bowel obstruction as abdomen is nondistended normal bowel sounds, so passing gas and having normal bowel movements.  Low suspicion for complicated diverticulitis as she is nontoxic-appearing, vital signs reassuring no leukocytosis present.  Low suspicion for appendicitis as she has no right lower quadrant tenderness, vital signs reassuring.  Suspicion for ACS is low patient has low risk factors EKG without signs of ischemia.  Doubt PE as she is PERC negative.   Dispostion and problem list  After consideration of the diagnostic results and the patients response to treatment, I feel that the patent would benefit from discharge.  Nausea vomiting-since resolved, likely gastritis, will recommend bland diet follow-up PCP as needed strict return precautions.            Final Clinical Impression(s) / ED Diagnoses Final diagnoses:  Nausea and vomiting, unspecified vomiting type    Rx / DC Orders ED Discharge Orders     None         Carroll Sage, PA-C 06/09/22 0533    Shon Baton, MD 06/09/22 [phone]

## 2022-06-28 DIAGNOSIS — Z419 Encounter for procedure for purposes other than remedying health state, unspecified: Secondary | ICD-10-CM | POA: Diagnosis not present

## 2022-07-28 DIAGNOSIS — Z419 Encounter for procedure for purposes other than remedying health state, unspecified: Secondary | ICD-10-CM | POA: Diagnosis not present

## 2022-08-28 DIAGNOSIS — Z419 Encounter for procedure for purposes other than remedying health state, unspecified: Secondary | ICD-10-CM | POA: Diagnosis not present

## 2022-09-28 DIAGNOSIS — Z419 Encounter for procedure for purposes other than remedying health state, unspecified: Secondary | ICD-10-CM | POA: Diagnosis not present

## 2022-10-28 DIAGNOSIS — Z419 Encounter for procedure for purposes other than remedying health state, unspecified: Secondary | ICD-10-CM | POA: Diagnosis not present

## 2022-11-28 DIAGNOSIS — Z419 Encounter for procedure for purposes other than remedying health state, unspecified: Secondary | ICD-10-CM | POA: Diagnosis not present

## 2022-12-09 ENCOUNTER — Encounter: Payer: Self-pay | Admitting: Nurse Practitioner

## 2022-12-09 ENCOUNTER — Ambulatory Visit: Payer: Medicaid Other | Admitting: Nurse Practitioner

## 2022-12-09 VITALS — BP 98/60 | HR 80 | Temp 98.7°F | Ht 63.0 in | Wt 143.0 lb

## 2022-12-09 DIAGNOSIS — Z0000 Encounter for general adult medical examination without abnormal findings: Secondary | ICD-10-CM

## 2022-12-09 DIAGNOSIS — Z0001 Encounter for general adult medical examination with abnormal findings: Secondary | ICD-10-CM

## 2022-12-09 DIAGNOSIS — L309 Dermatitis, unspecified: Secondary | ICD-10-CM

## 2022-12-09 DIAGNOSIS — J4520 Mild intermittent asthma, uncomplicated: Secondary | ICD-10-CM

## 2022-12-09 DIAGNOSIS — Z1329 Encounter for screening for other suspected endocrine disorder: Secondary | ICD-10-CM

## 2022-12-09 DIAGNOSIS — Z13220 Encounter for screening for lipoid disorders: Secondary | ICD-10-CM | POA: Diagnosis not present

## 2022-12-09 DIAGNOSIS — G4719 Other hypersomnia: Secondary | ICD-10-CM | POA: Insufficient documentation

## 2022-12-09 HISTORY — DX: Encounter for general adult medical examination with abnormal findings: Z00.01

## 2022-12-09 NOTE — Assessment & Plan Note (Signed)
Physical exam complete. Routine blood work as outlined. We will contact her with the results. Pap smear is up to date. She politely declined the flu vaccine today and has never received any COVID vaccines. Her tetanus vaccine is up to date. She is encouraged to establish care with a dentist and eye doctor for annual exams. Advice on maintaining a healthy diet and exercise regimen is provided. Return to care in one year, sooner as needed.

## 2022-12-09 NOTE — Assessment & Plan Note (Signed)
Infrequent symptoms occur only with cold weather, and she currently does not use an inhaler. No changes to current management are necessary.
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
## 2022-12-09 NOTE — Assessment & Plan Note (Signed)
Her eczema is well controlled without the need for medications. No changes to current management are required.

## 2022-12-10 ENCOUNTER — Other Ambulatory Visit: Payer: Self-pay

## 2022-12-10 ENCOUNTER — Telehealth: Payer: Self-pay

## 2022-12-10 DIAGNOSIS — D72819 Decreased white blood cell count, unspecified: Secondary | ICD-10-CM

## 2022-12-10 LAB — CBC WITH DIFFERENTIAL/PLATELET
Basophils Absolute: 0 10*3/uL (ref 0.0–0.1)
Basophils Relative: 0.8 % (ref 0.0–3.0)
Eosinophils Absolute: 0.1 10*3/uL (ref 0.0–0.7)
Eosinophils Relative: 3.4 % (ref 0.0–5.0)
HCT: 38.9 % (ref 36.0–46.0)
Hemoglobin: 13 g/dL (ref 12.0–15.0)
Lymphocytes Relative: 28.5 % (ref 12.0–46.0)
Lymphs Abs: 0.9 10*3/uL (ref 0.7–4.0)
MCHC: 33.5 g/dL (ref 30.0–36.0)
MCV: 92.9 fL (ref 78.0–100.0)
Monocytes Absolute: 0.4 10*3/uL (ref 0.1–1.0)
Monocytes Relative: 13 % — ABNORMAL HIGH (ref 3.0–12.0)
Neutro Abs: 1.8 10*3/uL (ref 1.4–7.7)
Neutrophils Relative %: 54.3 % (ref 43.0–77.0)
Platelets: 198 10*3/uL (ref 150.0–400.0)
RBC: 4.19 Mil/uL (ref 3.87–5.11)
RDW: 12.6 % (ref 11.5–15.5)
WBC: 3.2 10*3/uL — ABNORMAL LOW (ref 4.0–10.5)

## 2022-12-10 LAB — VITAMIN D 25 HYDROXY (VIT D DEFICIENCY, FRACTURES): VITD: 13.34 ng/mL — ABNORMAL LOW (ref 30.00–100.00)

## 2022-12-10 LAB — LIPID PANEL
Cholesterol: 127 mg/dL (ref 0–200)
HDL: 71.7 mg/dL (ref >39.00)
LDL Cholesterol: 45 mg/dL (ref 0–99)
NonHDL: 55.76
Total CHOL/HDL Ratio: 2
Triglycerides: 54 mg/dL (ref 0.0–149.0)
VLDL: 10.8 mg/dL (ref 0.0–40.0)

## 2022-12-10 LAB — COMPREHENSIVE METABOLIC PANEL
ALT: 13 U/L (ref 0–35)
AST: 21 U/L (ref 0–37)
Albumin: 4.4 g/dL (ref 3.5–5.2)
Alkaline Phosphatase: 81 U/L (ref 39–117)
BUN: 9 mg/dL (ref 6–23)
CO2: 29 meq/L (ref 19–32)
Calcium: 9.3 mg/dL (ref 8.4–10.5)
Chloride: 102 meq/L (ref 96–112)
Creatinine, Ser: 0.67 mg/dL (ref 0.40–1.20)
GFR: 115.62 mL/min (ref >60.00)
Glucose, Bld: 78 mg/dL (ref 70–99)
Potassium: 4.1 meq/L (ref 3.5–5.1)
Sodium: 136 meq/L (ref 135–145)
Total Bilirubin: 0.6 mg/dL (ref 0.2–1.2)
Total Protein: 7.6 g/dL (ref 6.0–8.3)

## 2022-12-10 LAB — TSH: TSH: 1.65 u[IU]/mL (ref 0.35–5.50)

## 2022-12-10 LAB — VITAMIN B12: Vitamin B-12: 306 pg/mL (ref 211–911)

## 2022-12-10 NOTE — Telephone Encounter (Signed)
Called pt in regards to labs was unable to leave a vm a mychart msg has been sent

## 2022-12-24 ENCOUNTER — Encounter: Payer: Self-pay | Admitting: Internal Medicine

## 2022-12-28 DIAGNOSIS — Z419 Encounter for procedure for purposes other than remedying health state, unspecified: Secondary | ICD-10-CM | POA: Diagnosis not present

## 2023-01-05 ENCOUNTER — Other Ambulatory Visit (INDEPENDENT_AMBULATORY_CARE_PROVIDER_SITE_OTHER): Payer: Medicaid Other

## 2023-01-05 DIAGNOSIS — D72819 Decreased white blood cell count, unspecified: Secondary | ICD-10-CM

## 2023-01-05 LAB — CBC WITH DIFFERENTIAL/PLATELET
Basophils Absolute: 0 10*3/uL (ref 0.0–0.1)
Basophils Relative: 0.7 % (ref 0.0–3.0)
Eosinophils Absolute: 0.3 10*3/uL (ref 0.0–0.7)
Eosinophils Relative: 8.7 % — ABNORMAL HIGH (ref 0.0–5.0)
HCT: 36.2 % (ref 36.0–46.0)
Hemoglobin: 12.3 g/dL (ref 12.0–15.0)
Lymphocytes Relative: 26.3 % (ref 12.0–46.0)
Lymphs Abs: 0.9 10*3/uL (ref 0.7–4.0)
MCHC: 34 g/dL (ref 30.0–36.0)
MCV: 92.5 fL (ref 78.0–100.0)
Monocytes Absolute: 0.4 10*3/uL (ref 0.1–1.0)
Monocytes Relative: 12.3 % — ABNORMAL HIGH (ref 3.0–12.0)
Neutro Abs: 1.8 10*3/uL (ref 1.4–7.7)
Neutrophils Relative %: 52 % (ref 43.0–77.0)
Platelets: 187 10*3/uL (ref 150.0–400.0)
RBC: 3.92 Mil/uL (ref 3.87–5.11)
RDW: 12.5 % (ref 11.5–15.5)
WBC: 3.6 10*3/uL — ABNORMAL LOW (ref 4.0–10.5)

## 2023-01-28 DIAGNOSIS — Z419 Encounter for procedure for purposes other than remedying health state, unspecified: Secondary | ICD-10-CM | POA: Diagnosis not present

## 2023-02-26 ENCOUNTER — Institutional Professional Consult (permissible substitution): Payer: Medicaid Other | Admitting: Internal Medicine

## 2023-02-27 ENCOUNTER — Encounter: Payer: Self-pay | Admitting: Internal Medicine

## 2023-02-27 ENCOUNTER — Ambulatory Visit (INDEPENDENT_AMBULATORY_CARE_PROVIDER_SITE_OTHER): Payer: Medicaid Other | Admitting: Internal Medicine

## 2023-02-27 VITALS — BP 90/58 | HR 86 | Temp 97.6°F | Ht 63.0 in | Wt 146.0 lb

## 2023-02-27 DIAGNOSIS — G4733 Obstructive sleep apnea (adult) (pediatric): Secondary | ICD-10-CM

## 2023-02-27 DIAGNOSIS — R400 Somnolence: Secondary | ICD-10-CM
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
## 2023-02-27 NOTE — Patient Instructions (Addendum)
Patient Instructions  Be aware of reduced alertness and do not drive or operate heavy machinery if experiencing  drowsiness.  Exercise encouraged, as tolerated. Encouraged proper weight management.  Important to get eight or more hours of sleep  Limiting the use of the computer and television before bedtime.  Decrease naps during the day, so night time sleep will become enhanced.  Limit caffeine, and sleep deprivation.   Plan for SPLIT NIGHT SLEEP STUDY IN LAB TO ASSESS FOR NARCOLEPSY

## 2023-02-28 DIAGNOSIS — Z419 Encounter for procedure for purposes other than remedying health state, unspecified: Secondary | ICD-10-CM | POA: Diagnosis not present

## 2023-03-20 ENCOUNTER — Ambulatory Visit: Payer: Medicaid Other | Attending: Otolaryngology

## 2023-03-20 DIAGNOSIS — G4710 Hypersomnia, unspecified: Secondary | ICD-10-CM | POA: Insufficient documentation

## 2023-03-20 DIAGNOSIS — G4733 Obstructive sleep apnea (adult) (pediatric): Secondary | ICD-10-CM | POA: Diagnosis not present

## 2023-03-20 DIAGNOSIS — R400 Somnolence: Secondary | ICD-10-CM | POA: Insufficient documentation

## 2023-03-20 DIAGNOSIS — J45909 Unspecified asthma, uncomplicated: Secondary | ICD-10-CM | POA: Insufficient documentation

## 2023-03-21 ENCOUNTER — Ambulatory Visit: Payer: Medicaid Other | Attending: Otolaryngology

## 2023-03-21 DIAGNOSIS — J45909 Unspecified asthma, uncomplicated: Secondary | ICD-10-CM | POA: Insufficient documentation

## 2023-03-21 DIAGNOSIS — G4710 Hypersomnia, unspecified: Secondary | ICD-10-CM | POA: Insufficient documentation

## 2023-03-21 DIAGNOSIS — G4733 Obstructive sleep apnea (adult) (pediatric): Secondary | ICD-10-CM | POA: Diagnosis not present

## 2023-03-21 DIAGNOSIS — R400 Somnolence: Secondary | ICD-10-CM | POA: Insufficient documentation

## 2023-03-28 DIAGNOSIS — Z419 Encounter for procedure for purposes other than remedying health state, unspecified: Secondary | ICD-10-CM | POA: Diagnosis not present

## 2023-03-30 ENCOUNTER — Telehealth (INDEPENDENT_AMBULATORY_CARE_PROVIDER_SITE_OTHER): Payer: Self-pay | Admitting: Pulmonary Disease

## 2023-03-30 DIAGNOSIS — G4710 Hypersomnia, unspecified: Secondary | ICD-10-CM | POA: Diagnosis not present

## 2023-03-30 NOTE — Telephone Encounter (Signed)
 MSLT showed SOREMs x 2-3 which corelates with narcolepsy Please consider treatment

## 2023-04-07 NOTE — Telephone Encounter (Signed)
 Lm x1 for the patient.

## 2023-04-08 ENCOUNTER — Telehealth: Payer: Self-pay | Admitting: Internal Medicine

## 2023-04-08 NOTE — Telephone Encounter (Signed)
 PT is ret Kristina's call. Please try again. She states MYCHART is best cause she can't take calls at work.

## 2023-04-08 NOTE — Telephone Encounter (Signed)
 MyChart message sent through telephone encounter from 03/30/2023.  Closing this encounter.

## 2023-04-09 NOTE — Telephone Encounter (Deleted)
 When do you have a day off so we can get you in with Dr. Betti Cruz? She will be able to answer your questions when you see her.  Foye Clock, CMA

## 2023-04-13 ENCOUNTER — Encounter: Payer: Self-pay | Admitting: Sleep Medicine

## 2023-04-13 ENCOUNTER — Ambulatory Visit: Admitting: Sleep Medicine

## 2023-04-13 VITALS — BP 110/60 | HR 83 | Temp 96.9°F | Ht 63.0 in | Wt 144.0 lb

## 2023-04-13 DIAGNOSIS — G4710 Hypersomnia, unspecified: Secondary | ICD-10-CM

## 2023-04-13 DIAGNOSIS — F5112 Insufficient sleep syndrome: Secondary | ICD-10-CM | POA: Diagnosis not present

## 2023-04-13 NOTE — Patient Instructions (Signed)
 Marland Kitchen
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
## 2023-05-03 ENCOUNTER — Other Ambulatory Visit: Payer: Self-pay

## 2023-05-03 ENCOUNTER — Encounter (HOSPITAL_COMMUNITY): Payer: Self-pay

## 2023-05-03 ENCOUNTER — Emergency Department (HOSPITAL_COMMUNITY)
Admission: EM | Admit: 2023-05-03 | Discharge: 2023-05-03 | Disposition: A | Discharge status: Home / Self Care | Attending: Emergency Medicine | Admitting: Emergency Medicine

## 2023-05-03 DIAGNOSIS — Z91040 Latex allergy status: Secondary | ICD-10-CM | POA: Insufficient documentation

## 2023-05-03 DIAGNOSIS — R112 Nausea with vomiting, unspecified: Secondary | ICD-10-CM | POA: Insufficient documentation

## 2023-05-03 DIAGNOSIS — R194 Change in bowel habit: Secondary | ICD-10-CM | POA: Insufficient documentation

## 2023-05-03 DIAGNOSIS — Z91010 Allergy to peanuts: Secondary | ICD-10-CM | POA: Insufficient documentation

## 2023-05-03 LAB — COMPREHENSIVE METABOLIC PANEL WITH GFR
ALT: 15 U/L (ref 0–44)
AST: 21 U/L (ref 15–41)
Albumin: 4 g/dL (ref 3.5–5.0)
Alkaline Phosphatase: 80 U/L (ref 38–126)
Anion gap: 7 (ref 5–15)
BUN: 10 mg/dL (ref 6–20)
CO2: 24 mmol/L (ref 22–32)
Calcium: 9.2 mg/dL (ref 8.9–10.3)
Chloride: 107 mmol/L (ref 98–111)
Creatinine, Ser: 0.68 mg/dL (ref 0.44–1.00)
GFR, Estimated: >60 mL/min (ref >60)
Glucose, Bld: 98 mg/dL (ref 70–99)
Potassium: 3.7 mmol/L (ref 3.5–5.1)
Sodium: 138 mmol/L (ref 135–145)
Total Bilirubin: 0.7 mg/dL (ref 0.0–1.2)
Total Protein: 7.6 g/dL (ref 6.5–8.1)

## 2023-05-03 LAB — CBC
HCT: 38.7 % (ref 36.0–46.0)
Hemoglobin: 12.9 g/dL (ref 12.0–15.0)
MCH: 30.7 pg (ref 26.0–34.0)
MCHC: 33.3 g/dL (ref 30.0–36.0)
MCV: 92.1 fL (ref 80.0–100.0)
Platelets: 190 10*3/uL (ref 150–400)
RBC: 4.2 MIL/uL (ref 3.87–5.11)
RDW: 11.9 % (ref 11.5–15.5)
WBC: 6.7 10*3/uL (ref 4.0–10.5)
nRBC: 0 % (ref 0.0–0.2)

## 2023-05-03 LAB — HCG, SERUM, QUALITATIVE: Preg, Serum: NEGATIVE

## 2023-05-03 LAB — LIPASE, BLOOD: Lipase: 43 U/L (ref 11–51)

## 2023-05-03 MED ORDER — ONDANSETRON HCL 4 MG/2ML IJ SOLN
4.0000 mg | Freq: Once | INTRAMUSCULAR | Status: AC
  Administered 2023-05-03: 4 mg via INTRAVENOUS
  Filled 2023-05-03: qty 2

## 2023-05-03 MED ORDER — ONDANSETRON 4 MG PO TBDP: ORAL_TABLET | ORAL | 0 refills | Status: DC

## 2023-05-03 MED ORDER — MORPHINE SULFATE (PF) 2 MG/ML IV SOLN
2.0000 mg | Freq: Once | INTRAVENOUS | Status: AC
  Administered 2023-05-03: 2 mg via INTRAVENOUS
  Filled 2023-05-03: qty 1

## 2023-05-03 MED ORDER — SODIUM CHLORIDE 0.9 % IV BOLUS
1000.0000 mL | Freq: Once | INTRAVENOUS | Status: AC
  Administered 2023-05-03: 1000 mL via INTRAVENOUS

## 2023-05-03 NOTE — Discharge Instructions (Signed)
 Please follow-up with your family doctor in the office.  Please return for sudden worsening abdominal pain especially if it occurs in one location or inability eat or drink.

## 2023-05-03 NOTE — ED Triage Notes (Signed)
 Reports abd pain that started today with nausea vomiting and diarrhea.  Denies sick contact and is on her period.  Most complaint is LUQ?LLQ

## 2023-05-03 NOTE — ED Provider Notes (Signed)
 [place] EMERGENCY DEPARTMENT AT [facility] Provider Note   CSN: [id number] Arrival date & time: 05/03/23  1733     History  Chief Complaint  Patient presents with   Abdominal Pain    Cynthia Mcdowell is a 33 y.o. female.  33 yo F with a chief complaints of nausea vomiting and frequent stools.  This has been going on since this morning.  No known sick contacts.  No recent travel no recent international travel.  No dark or bloody stool.  Mild abdominal cramping.   Abdominal Pain      Home Medications Prior to Admission medications   Medication Sig Start Date End Date Taking? Authorizing Provider  ondansetron (ZOFRAN-ODT) 4 MG disintegrating tablet 4mg  ODT q4 hours prn nausea/vomit 05/03/23  Yes Melene Plan, DO      Allergies    Peanut oil, Peanut-containing drug products, Shellfish allergy, and Latex    Review of Systems   Review of Systems  Gastrointestinal:  Positive for abdominal pain.    Physical Exam Updated Vital Signs BP 106/83 (BP Location: Right Arm)   Pulse 96   Temp 98.9 F (37.2 C)   Resp 16   Ht 5\' 3"  (1.6 m)   Wt 65.3 kg   SpO2 100%   BMI 25.51 kg/m  Physical Exam Vitals and nursing note reviewed.  Constitutional:      General: She is not in acute distress.    Appearance: She is well-developed. She is not diaphoretic.  HENT:     Head: Normocephalic and atraumatic.  Eyes:     Pupils: Pupils are equal, round, and reactive to light.  Cardiovascular:     Rate and Rhythm: Normal rate and regular rhythm.     Heart sounds: No murmur heard.    No friction rub. No gallop.  Pulmonary:     Effort: Pulmonary effort is normal.     Breath sounds: No wheezing or rales.  Abdominal:     General: There is no distension.     Palpations: Abdomen is soft.     Tenderness: There is no abdominal tenderness.  Musculoskeletal:        General: No tenderness.     Cervical back: Normal range of motion and neck supple.  Skin:    General: Skin is warm  and dry.  Neurological:     Mental Status: She is alert and oriented to person, place, and time.  Psychiatric:        Behavior: Behavior normal.     ED Results / Procedures / Treatments   Labs (all labs ordered are listed, but only abnormal results are displayed) Labs Reviewed  LIPASE, BLOOD  COMPREHENSIVE METABOLIC PANEL WITH GFR  CBC  HCG, SERUM, QUALITATIVE  URINALYSIS, ROUTINE W REFLEX MICROSCOPIC    EKG None  Radiology No results found.  Procedures Procedures    Medications Ordered in ED Medications  sodium chloride 0.9 % bolus 1,000 mL (1,000 mLs Intravenous New Bag/Given 05/03/23 1923)  ondansetron [facility]) injection 4 mg (4 mg Intravenous Given 05/03/23 1919)  morphine (PF) 2 MG/ML injection 2 mg (2 mg Intravenous Given 05/03/23 1919)    ED Course/ Medical Decision Making/ A&P                                 Medical Decision Making Amount and/or Complexity of Data Reviewed Labs: ordered.  Risk Prescription drug management.   32  yo F with a chief complaints of nausea vomiting and diarrhea.  Going on just since this morning.  Benign abdominal exam.  Appears well-hydrated.  Will give a bolus of IV fluids antiemetics reassess.  LFTs and lipase are unremarkable.  No change to baseline renal function.  No anemia.  No leukocytosis.  Patient feeling a bit better on repeat assessment.  Tolerating by mouth.  Will discharge home.  PCP follow-up.  7:27 PM:  I have discussed the diagnosis/risks/treatment options with the patient.  Evaluation and diagnostic testing in the emergency department does not suggest an emergent condition requiring admission or immediate intervention beyond what has been performed at this time.  They will follow up with PCP. We also discussed returning to the ED immediately if new or worsening sx occur. We discussed the sx which are most concerning (e.g., sudden worsening pain, fever, inability to tolerate by mouth) that necessitate immediate return.  Medications administered to the patient during their visit and any new prescriptions provided to the patient are listed below.  Medications given during this visit Medications  sodium chloride 0.9 % bolus 1,000 mL (1,000 mLs Intravenous New Bag/Given 05/03/23 1923)  ondansetron [facility]) injection 4 mg (4 mg Intravenous Given 05/03/23 1919)  morphine (PF) 2 MG/ML injection 2 mg (2 mg Intravenous Given 05/03/23 1919)     The patient appears reasonably screen and/or stabilized for discharge and I doubt any other medical condition or other [facility] requiring further screening, evaluation, or treatment in the ED at this time prior to discharge.          Final Clinical Impression(s) / ED Diagnoses Final diagnoses:  Nausea and vomiting in adult  Frequent bowel movements    Rx / DC Orders ED Discharge Orders          Ordered    ondansetron (ZOFRAN-ODT) 4 MG disintegrating tablet        05/03/23 1925              Melene Plan, DO 05/03/23 1927

## 2023-05-09 DIAGNOSIS — Z419 Encounter for procedure for purposes other than remedying health state, unspecified: Secondary | ICD-10-CM | POA: Diagnosis not present

## 2023-06-08 DIAGNOSIS — Z419 Encounter for procedure for purposes other than remedying health state, unspecified: Secondary | ICD-10-CM | POA: Diagnosis not present

## 2023-06-18 ENCOUNTER — Ambulatory Visit: Attending: Sleep Medicine

## 2023-06-18 DIAGNOSIS — G4761 Periodic limb movement disorder: Secondary | ICD-10-CM | POA: Insufficient documentation

## 2023-06-18 DIAGNOSIS — G4736 Sleep related hypoventilation in conditions classified elsewhere: Secondary | ICD-10-CM | POA: Insufficient documentation

## 2023-06-18 DIAGNOSIS — R400 Somnolence: Secondary | ICD-10-CM | POA: Insufficient documentation

## 2023-06-18 DIAGNOSIS — G4733 Obstructive sleep apnea (adult) (pediatric): Secondary | ICD-10-CM | POA: Diagnosis not present

## 2023-06-19 ENCOUNTER — Ambulatory Visit: Attending: Sleep Medicine

## 2023-06-19 DIAGNOSIS — R400 Somnolence: Secondary | ICD-10-CM | POA: Diagnosis present

## 2023-06-19 DIAGNOSIS — G4761 Periodic limb movement disorder: Secondary | ICD-10-CM | POA: Insufficient documentation

## 2023-06-19 DIAGNOSIS — G4733 Obstructive sleep apnea (adult) (pediatric): Secondary | ICD-10-CM | POA: Insufficient documentation

## 2023-06-19 DIAGNOSIS — G47419 Narcolepsy without cataplexy: Secondary | ICD-10-CM | POA: Insufficient documentation

## 2023-06-19 DIAGNOSIS — G4736 Sleep related hypoventilation in conditions classified elsewhere: Secondary | ICD-10-CM | POA: Diagnosis not present

## 2023-06-19 DIAGNOSIS — G4710 Hypersomnia, unspecified: Secondary | ICD-10-CM

## 2023-06-24 DIAGNOSIS — G4710 Hypersomnia, unspecified: Secondary | ICD-10-CM

## 2023-07-03 ENCOUNTER — Ambulatory Visit (INDEPENDENT_AMBULATORY_CARE_PROVIDER_SITE_OTHER): Payer: Self-pay | Admitting: Sleep Medicine

## 2023-07-03 DIAGNOSIS — G4710 Hypersomnia, unspecified: Secondary | ICD-10-CM | POA: Diagnosis not present

## 2023-07-09 DIAGNOSIS — Z419 Encounter for procedure for purposes other than remedying health state, unspecified: Secondary | ICD-10-CM | POA: Diagnosis not present

## 2023-07-13 ENCOUNTER — Ambulatory Visit (INDEPENDENT_AMBULATORY_CARE_PROVIDER_SITE_OTHER): Admitting: Sleep Medicine

## 2023-07-13 ENCOUNTER — Encounter: Payer: Self-pay | Admitting: Sleep Medicine

## 2023-07-13 VITALS — BP 100/60 | HR 84 | Temp 97.6°F | Ht 63.0 in | Wt 141.8 lb

## 2023-07-13 DIAGNOSIS — G47429 Narcolepsy in conditions classified elsewhere without cataplexy: Secondary | ICD-10-CM

## 2023-07-13 DIAGNOSIS — G47411 Narcolepsy with cataplexy: Secondary | ICD-10-CM

## 2023-07-13 NOTE — Progress Notes (Signed)
 Name:Cynthia Mcdowell MRN: [id number] DOB: [birth year]   CHIEF COMPLAINT:  Sleep study F/U   HISTORY OF PRESENT ILLNESS:  Cynthia Mcdowell is a 33 y.o. who presents to F/U on sleep study results. The patient underwent PSG and MSLT for further evaluation of hypersomnia. PSG did not reveal significant sleep disordered breathing and MSLT revealed a significantly decreased mean sleep latency time of 3 mins and 26 secs with 4 SOREM's.    Despite sleep study results, patient denies any daytime sleepiness or decreased quality of life. Also denies drowsy driving. She reports taking naps occasionally when she isn't working or taking care of her children. States that she does feel refreshed after naps.    EPWORTH SLEEP SCORE    02/27/2023   11:01 AM  Results of the Epworth flowsheet  Sitting and reading 1  Watching TV 0  Sitting, inactive in a public place (e.g. a theatre or a meeting) 0  As a passenger in a car for an hour without a break 0  Lying down to rest in the afternoon when circumstances permit 0  Sitting and talking to someone 0  Sitting quietly after a lunch without alcohol 0  In a car, while stopped for a few minutes in traffic 0  Total score 1    PAST MEDICAL HISTORY :   has a past medical history of Allergy, Asthma, Chlamydia contact, treated, Constipation (08/2008), Encounter for routine adult medical exam with abnormal findings (12/09/2022), H/O candidiasis, H/O varicella, chlamydia infection, and Irregular periods/menstrual cycles (08/10/2011).  has a past surgical history that includes laparoscopy (N/A, 03/30/2017). Prior to Admission medications   Medication Sig Start Date End Date Taking? Authorizing Provider  ondansetron  (ZOFRAN -ODT) 4 MG disintegrating tablet 4mg  ODT q4 hours prn nausea/vomit Patient not taking: Reported on 07/13/2023 05/03/23   Albertus Hughs, DO   Allergies  Allergen Reactions   Peanut Oil Anaphylaxis   Peanut-Containing Drug Products Anaphylaxis     Allergic to all nuts   Shellfish Allergy Anaphylaxis   Latex Itching    FAMILY HISTORY:  family history includes Asthma in her father; Breast cancer in her maternal grandmother; Cancer in her maternal grandmother; Heart disease in her maternal uncle; Hyperlipidemia in her father; Hypertension in her mother; Kidney disease in her paternal grandmother; Vision loss in her maternal grandfather. SOCIAL HISTORY:  reports that she has never smoked. She has never used smokeless tobacco. She reports that she does not drink alcohol and does not use drugs.   Review of Systems:  Gen:  Denies  fever, sweats, chills weight loss  HEENT: Denies blurred vision, double vision, ear pain, eye pain, hearing loss, nose bleeds, sore throat Cardiac:  No dizziness, chest pain or heaviness, chest tightness,edema, No JVD Resp:   No cough, -sputum production, -shortness of breath,-wheezing, -hemoptysis,  Gi: Denies swallowing difficulty, stomach pain, nausea or vomiting, diarrhea, constipation, bowel incontinence Gu:  Denies bladder incontinence, burning urine Ext:   Denies Joint pain, stiffness or swelling Skin: Denies  skin rash, easy bruising or bleeding or hives Endoc:  Denies polyuria, polydipsia , polyphagia or weight change Psych:   Denies depression, insomnia or hallucinations  Other:  All other systems negative  VITAL SIGNS: BP 100/60 (BP Location: Left Arm, Patient Position: Sitting, Cuff Size: Normal)   Pulse 84   Temp 97.6 F (36.4 C) (Oral)   Ht 5' 3 (1.6 m)   Wt 141 lb 12.8 oz (64.3 kg)   SpO2 99%  BMI 25.12 kg/m    Physical Examination:   General Appearance: No distress  EYES PERRLA, EOM intact.   NECK Supple, No JVD Pulmonary: normal breath sounds, No wheezing.  CardiovascularNormal S1,S2.  No m/r/g.   Abdomen: Benign, Soft, non-tender. Skin:   warm, no rashes, no ecchymosis  Extremities: normal, no cyanosis, clubbing. Neuro:without focal findings,  speech normal  PSYCHIATRIC:  Mood, affect within normal limits.   ASSESSMENT AND PLAN  Narcolepsy Patient does not wish to start on any stimulants or other medications at this time. States that she does not feel excessively sleepy throughout the day. Advised not to drive drowsy for safety of patient and others. Counseled patient on maximizing total sleep time to 7-8 hours per night. Will follow up as needed.     Patient  satisfied with Plan of action and management. All questions answered  I spent a total of 30 minutes reviewing chart data, face-to-face evaluation with the patient, counseling and coordination of care as detailed above.    Melina Mosteller, M.D.  Sleep Medicine [place] [facility]

## 2023-07-21 ENCOUNTER — Encounter: Payer: Self-pay | Admitting: Sleep Medicine

## 2023-08-08 DIAGNOSIS — Z419 Encounter for procedure for purposes other than remedying health state, unspecified: Secondary | ICD-10-CM | POA: Diagnosis not present

## 2023-09-01 ENCOUNTER — Telehealth (INDEPENDENT_AMBULATORY_CARE_PROVIDER_SITE_OTHER): Payer: Self-pay | Admitting: Sleep Medicine

## 2023-09-01 ENCOUNTER — Telehealth: Payer: Self-pay | Admitting: Sleep Medicine

## 2023-09-01 NOTE — Telephone Encounter (Signed)
 MSLT uploaded. Results and treatment options were discussed with patient.

## 2023-09-01 NOTE — Telephone Encounter (Signed)
 PSG uploaded. Results were reviewed with patient.

## 2023-09-08 DIAGNOSIS — Z419 Encounter for procedure for purposes other than remedying health state, unspecified: Secondary | ICD-10-CM | POA: Diagnosis not present

## 2023-09-25 ENCOUNTER — Encounter: Payer: Self-pay | Admitting: Nurse Practitioner

## 2023-10-01 ENCOUNTER — Telehealth: Payer: Self-pay

## 2023-10-01 NOTE — Telephone Encounter (Signed)
 DMV forms received and placed in provider to be signed folder see pts mychart thread on 09-25-23

## 2023-10-09 DIAGNOSIS — Z419 Encounter for procedure for purposes other than remedying health state, unspecified: Secondary | ICD-10-CM | POA: Diagnosis not present

## 2023-10-16 ENCOUNTER — Telehealth: Payer: Self-pay

## 2023-10-16 NOTE — Telephone Encounter (Signed)
 Copied from CRM #[id number]. Topic: Clinical - Medical Advice >> Oct 16, 2023  4:30 PM Anairis L wrote: Reason for CRM: Patient is calling to schedule an appointment to get [facility] forms filled out but no app are available until December. Please advise.

## 2023-10-19 ENCOUNTER — Telehealth: Payer: Self-pay | Admitting: Nurse Practitioner

## 2023-10-19 NOTE — Telephone Encounter (Signed)
 Lm that appointment on 10/20/2023 has to be reschedule to Oct. 1st at 4pm in office. Provider is out on 10/20/2023. At this time Oct 1st does not need to be rescheduled.

## 2023-10-20 ENCOUNTER — Ambulatory Visit: Admitting: Nurse Practitioner

## 2023-10-28 ENCOUNTER — Ambulatory Visit: Admitting: Nurse Practitioner

## 2023-10-28 VITALS — BP 112/71 | HR 86 | Temp 98.2°F | Ht 63.0 in | Wt 142.0 lb

## 2023-10-28 DIAGNOSIS — G47429 Narcolepsy in conditions classified elsewhere without cataplexy: Secondary | ICD-10-CM | POA: Diagnosis not present

## 2023-10-28 DIAGNOSIS — Z0279 Encounter for issue of other medical certificate: Secondary | ICD-10-CM

## 2023-10-28 DIAGNOSIS — Z23 Encounter for immunization: Secondary | ICD-10-CM | POA: Diagnosis not present

## 2023-10-28 DIAGNOSIS — G47419 Narcolepsy without cataplexy: Secondary | ICD-10-CM | POA: Diagnosis not present
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
## 2023-10-28 NOTE — Assessment & Plan Note (Signed)
 Diagnosed via sleep study. She is not on medication and experiences no excessive daytime sleepiness. Concerns about medication were discussed. Complete DMV paperwork for restricted driving: daytime only, within a 60-mile radius. Avoid driving if excessively tired. Advised adequate sleep of 7-8 hours per night. Report symptom changes or if medication is reconsidered. Follow up with Pulmonology/Sleep Medicine as needed.

## 2023-11-07 ENCOUNTER — Other Ambulatory Visit: Payer: Self-pay

## 2023-11-07 ENCOUNTER — Emergency Department (HOSPITAL_COMMUNITY)
Admission: EM | Admit: 2023-11-07 | Discharge: 2023-11-07 | Discharge status: Against Medical Advice | Attending: Emergency Medicine | Admitting: Emergency Medicine

## 2023-11-07 ENCOUNTER — Encounter (HOSPITAL_COMMUNITY): Payer: Self-pay | Admitting: *Deleted

## 2023-11-07 DIAGNOSIS — R350 Frequency of micturition: Secondary | ICD-10-CM | POA: Insufficient documentation

## 2023-11-07 DIAGNOSIS — Z5321 Procedure and treatment not carried out due to patient leaving prior to being seen by health care provider: Secondary | ICD-10-CM | POA: Insufficient documentation

## 2023-11-07 LAB — CBC
HCT: 32.6 % — ABNORMAL LOW (ref 36.0–46.0)
Hemoglobin: 11 g/dL — ABNORMAL LOW (ref 12.0–15.0)
MCH: 30.6 pg (ref 26.0–34.0)
MCHC: 33.7 g/dL (ref 30.0–36.0)
MCV: 90.6 fL (ref 80.0–100.0)
Platelets: 196 K/uL (ref 150–400)
RBC: 3.6 MIL/uL — ABNORMAL LOW (ref 3.87–5.11)
RDW: 12.4 % (ref 11.5–15.5)
WBC: 5 K/uL (ref 4.0–10.5)
nRBC: 0 % (ref 0.0–0.2)

## 2023-11-07 LAB — COMPREHENSIVE METABOLIC PANEL WITH GFR
ALT: 11 U/L (ref 0–44)
AST: 17 U/L (ref 15–41)
Albumin: 3.5 g/dL (ref 3.5–5.0)
Alkaline Phosphatase: 81 U/L (ref 38–126)
Anion gap: 7 (ref 5–15)
BUN: 10 mg/dL (ref 6–20)
CO2: 27 mmol/L (ref 22–32)
Calcium: 8.9 mg/dL (ref 8.9–10.3)
Chloride: 102 mmol/L (ref 98–111)
Creatinine, Ser: 0.88 mg/dL (ref 0.44–1.00)
GFR, Estimated: >60 mL/min (ref >60)
Glucose, Bld: 101 mg/dL — ABNORMAL HIGH (ref 70–99)
Potassium: 3.6 mmol/L (ref 3.5–5.1)
Sodium: 136 mmol/L (ref 135–145)
Total Bilirubin: 0.4 mg/dL (ref 0.0–1.2)
Total Protein: 6.7 g/dL (ref 6.5–8.1)

## 2023-11-07 LAB — URINALYSIS, ROUTINE W REFLEX MICROSCOPIC
Bilirubin Urine: NEGATIVE
Glucose, UA: NEGATIVE mg/dL
Ketones, ur: NEGATIVE mg/dL
Nitrite: POSITIVE — AB
Protein, ur: 100 mg/dL — AB
RBC / HPF: >50 RBC/hpf (ref 0–5)
Specific Gravity, Urine: 1.023 (ref 1.005–1.030)
pH: 6 (ref 5.0–8.0)

## 2023-11-07 LAB — HCG, SERUM, QUALITATIVE: Preg, Serum: NEGATIVE

## 2023-11-07 NOTE — ED Triage Notes (Signed)
Pt states that she thinks she has a UTI.

## 2023-11-08 ENCOUNTER — Other Ambulatory Visit: Payer: Self-pay

## 2023-11-08 ENCOUNTER — Emergency Department (HOSPITAL_COMMUNITY)
Admission: EM | Admit: 2023-11-08 | Discharge: 2023-11-08 | Disposition: A | Discharge status: Home / Self Care | Attending: Emergency Medicine | Admitting: Emergency Medicine

## 2023-11-08 ENCOUNTER — Encounter (HOSPITAL_COMMUNITY): Payer: Self-pay | Admitting: *Deleted

## 2023-11-08 DIAGNOSIS — N3001 Acute cystitis with hematuria: Secondary | ICD-10-CM | POA: Insufficient documentation

## 2023-11-08 DIAGNOSIS — J45909 Unspecified asthma, uncomplicated: Secondary | ICD-10-CM | POA: Insufficient documentation

## 2023-11-08 DIAGNOSIS — Z91010 Allergy to peanuts: Secondary | ICD-10-CM | POA: Insufficient documentation

## 2023-11-08 DIAGNOSIS — R300 Dysuria: Secondary | ICD-10-CM | POA: Diagnosis present

## 2023-11-08 DIAGNOSIS — Z91040 Latex allergy status: Secondary | ICD-10-CM | POA: Diagnosis not present

## 2023-11-08 MED ORDER — CEPHALEXIN 500 MG PO CAPS: 500.0000 mg | ORAL_CAPSULE | Freq: Two times a day (BID) | ORAL | 0 refills | Status: AC

## 2023-11-08 NOTE — ED Provider Notes (Signed)
 [place] EMERGENCY DEPARTMENT AT [facility] Provider Note   CSN: [id number] Arrival date & time: 11/08/23  1550     Patient presents with: Urinary Frequency   Cynthia Mcdowell is a 33 y.o. female.   Patient with noncontributory past medical history presents today with complaints of dysuria and hematuria.  Reports symptoms have been ongoing for the last few days.  Denies any history of similar.  No abdominal pain or flank pain.  No fevers or chills.  She was seen for this yesterday but left prior to being seen.  Denies any vaginal discharge or concern for STDs.  The history is provided by the patient. No language interpreter was used.  Urinary Frequency       Prior to Admission medications   Not on File    Allergies: Peanut oil, Peanut-containing drug products, Shellfish allergy, and Latex    Review of Systems  Genitourinary:  Positive for dysuria, frequency and hematuria.  All other systems reviewed and are negative.   Updated Vital Signs BP 117/71   Pulse 83   Temp 97.9 F (36.6 C)   Resp 16   Ht 5' 3 (1.6 m)   Wt 64.4 kg   LMP 11/04/2023   SpO2 98%   BMI 25.15 kg/m   Physical Exam Vitals and nursing note reviewed.  Constitutional:      General: She is not in acute distress.    Appearance: Normal appearance. She is normal weight. She is not ill-appearing, toxic-appearing or diaphoretic.  HENT:     Head: Normocephalic and atraumatic.  Cardiovascular:     Rate and Rhythm: Normal rate.  Pulmonary:     Effort: Pulmonary effort is normal. No respiratory distress.  Abdominal:     General: Abdomen is flat.     Palpations: Abdomen is soft.     Tenderness: There is no abdominal tenderness. There is no right CVA tenderness or left CVA tenderness.  Musculoskeletal:        General: Normal range of motion.     Cervical back: Normal range of motion.  Skin:    General: Skin is warm and dry.  Neurological:     General: No focal deficit present.      Mental Status: She is alert.  Psychiatric:        Mood and Affect: Mood normal.        Behavior: Behavior normal.     (all labs ordered are listed, but only abnormal results are displayed) Labs Reviewed - No data to display  EKG: None  Radiology: No results found.   Procedures   Medications Ordered in the ED - No data to display                                  Medical Decision Making  This patient is a 33 y.o. female who presents to the ED for concern of dysuria, this involves an extensive number of treatment options, and is a complaint that carries with it a high risk of complications and morbidity. The emergent differential diagnosis prior to evaluation includes, but is not limited to,  UTI, pyelonephritis, sepsis, STI. This is not an exhaustive differential.   Past Medical History / Co-morbidities / Social History:  has a past medical history of Allergy, Asthma, Chlamydia contact, treated, Constipation (08/2008), Encounter for routine adult medical exam with abnormal findings (12/09/2022), H/O candidiasis, H/O varicella, chlamydia infection, and  Irregular periods/menstrual cycles (08/10/2011).  Additional history: Chart reviewed.  Physical Exam: Physical exam performed. The pertinent findings include: Well-appearing, abdomen soft and nontender.  No CVA tenderness.  Lab Tests: Labs ordered and obtained yesterday which have resulted and reveal no leukocytosis, Hgb 11.  hCG negative.  UA with RBCs, positive nitrites, moderate leukocytes, WBCs and bacteria consistent with UTI.  Disposition: After consideration of the diagnostic results and the patients response to treatment, I feel that emergency department workup does not suggest an emergent condition requiring admission or immediate intervention beyond what has been performed at this time. The plan is: Discharge with Keflex for UTI, close outpatient follow-up and return precautions.  Patient is well-appearing, her workup  yesterday revealed signs of a UTI which is consistent with her symptoms.  She has no signs or symptoms to suggest pyelonephritis. Evaluation and diagnostic testing in the emergency department does not suggest an emergent condition requiring admission or immediate intervention beyond what has been performed at this time.  Plan for discharge with close PCP follow-up.  Patient is understanding and amenable with plan, educated on red flag symptoms that would prompt immediate return.  Patient discharged in stable condition.  Final diagnoses:  Acute cystitis with hematuria    ED Discharge Orders          Ordered    cephALEXin (KEFLEX) 500 MG capsule  2 times daily        11/08/23 1715          An After Visit Summary was printed and given to the patient.      Farha Dano A, PA-C 11/08/23 1716    Geraldene Hamilton, MD 11/08/23 2047

## 2023-11-08 NOTE — Discharge Instructions (Signed)
 As we discussed, your workup in the ER today revealed that you have a urinary tract infection.  I have written you a prescription for antibiotics Keflex which you need to fill and take as prescribed in its entirety for management of the symptoms.  Please follow-up with your PCP after your symptoms improved to ensure that the blood in your urine has resolved as well.  Return if development of any fevers, back pain, or any new or worsening symptoms.

## 2023-11-08 NOTE — ED Triage Notes (Signed)
 Pt seen recently for UTI sxs but left AMA. Pt states that she is here for her results

## 2023-12-09 ENCOUNTER — Ambulatory Visit: Payer: Medicaid Other | Admitting: Nurse Practitioner

## 2023-12-09 VITALS — BP 118/72 | HR 84 | Temp 98.3°F | Ht 63.0 in | Wt 138.4 lb

## 2023-12-09 DIAGNOSIS — Z13220 Encounter for screening for lipoid disorders: Secondary | ICD-10-CM | POA: Diagnosis not present

## 2023-12-09 DIAGNOSIS — Z0000 Encounter for general adult medical examination without abnormal findings: Secondary | ICD-10-CM | POA: Diagnosis not present

## 2023-12-09 DIAGNOSIS — Z111 Encounter for screening for respiratory tuberculosis: Secondary | ICD-10-CM | POA: Diagnosis not present

## 2023-12-09 DIAGNOSIS — Z1329 Encounter for screening for other suspected endocrine disorder: Secondary | ICD-10-CM | POA: Diagnosis not present

## 2023-12-09 LAB — COMPREHENSIVE METABOLIC PANEL WITH GFR
ALT: 9 U/L (ref 0–35)
AST: 14 U/L (ref 0–37)
Albumin: 4.2 g/dL (ref 3.5–5.2)
Alkaline Phosphatase: 78 U/L (ref 39–117)
BUN: 10 mg/dL (ref 6–23)
CO2: 30 meq/L (ref 19–32)
Calcium: 8.9 mg/dL (ref 8.4–10.5)
Chloride: 105 meq/L (ref 96–112)
Creatinine, Ser: 0.67 mg/dL (ref 0.40–1.20)
GFR: 114.82 mL/min (ref >60.00)
Glucose, Bld: 82 mg/dL (ref 70–99)
Potassium: 4 meq/L (ref 3.5–5.1)
Sodium: 140 meq/L (ref 135–145)
Total Bilirubin: 0.4 mg/dL (ref 0.2–1.2)
Total Protein: 7.2 g/dL (ref 6.0–8.3)

## 2023-12-09 LAB — CBC WITH DIFFERENTIAL/PLATELET
Basophils Absolute: 0 K/uL (ref 0.0–0.1)
Basophils Relative: 0.9 % (ref 0.0–3.0)
Eosinophils Absolute: 0.1 K/uL (ref 0.0–0.7)
Eosinophils Relative: 5.4 % — ABNORMAL HIGH (ref 0.0–5.0)
HCT: 33.4 % — ABNORMAL LOW (ref 36.0–46.0)
Hemoglobin: 11.5 g/dL — ABNORMAL LOW (ref 12.0–15.0)
Lymphocytes Relative: 31.2 % (ref 12.0–46.0)
Lymphs Abs: 0.8 K/uL (ref 0.7–4.0)
MCHC: 34.4 g/dL (ref 30.0–36.0)
MCV: 88.5 fl (ref 78.0–100.0)
Monocytes Absolute: 0.3 K/uL (ref 0.1–1.0)
Monocytes Relative: 10.2 % (ref 3.0–12.0)
Neutro Abs: 1.3 K/uL — ABNORMAL LOW (ref 1.4–7.7)
Neutrophils Relative %: 52.3 % (ref 43.0–77.0)
Platelets: 183 K/uL (ref 150.0–400.0)
RBC: 3.78 Mil/uL — ABNORMAL LOW (ref 3.87–5.11)
RDW: 13.2 % (ref 11.5–15.5)
WBC: 2.5 K/uL — ABNORMAL LOW (ref 4.0–10.5)

## 2023-12-09 LAB — LIPID PANEL
Cholesterol: 116 mg/dL (ref 0–200)
HDL: 64.4 mg/dL (ref >39.00)
LDL Cholesterol: 38 mg/dL (ref 0–99)
NonHDL: 51.94
Total CHOL/HDL Ratio: 2
Triglycerides: 71 mg/dL (ref 0.0–149.0)
VLDL: 14.2 mg/dL (ref 0.0–40.0)

## 2023-12-09 LAB — TSH: TSH: 1.62 u[IU]/mL (ref 0.35–5.50)

## 2023-12-09 LAB — VITAMIN D 25 HYDROXY (VIT D DEFICIENCY, FRACTURES): VITD: 7.96 ng/mL — ABNORMAL LOW (ref 30.00–100.00)

## 2023-12-09 NOTE — Progress Notes (Signed)
 Leron Glance, NP-C Phone: [phone]  Cynthia Mcdowell is a 33 y.o. female who presents today for annual exam.   Discussed the use of AI scribe software for clinical note transcription with the patient, who gave verbal consent to proceed.  History of Present Illness   Cynthia Mcdowell is a 33 year old female who presents for annual exam.  She requires a two-step TB test for school.  She recently experienced her first urinary tract infection (UTI) during her menstrual cycle, with symptoms of dysuria and hematuria post-menstruation. She sought treatment at urgent care. Her menstrual cycles are light and regular, and she is not using any form of birth control.  She is sexually active and recently resumed sexual activity after having her last daughter. She reports no new medical problems or concerns and has good sleep despite a busy schedule with school and work.  She does not smoke, rarely consumes alcohol, and denies any drug use. She is currently in school for LPN and works at [employer], where she is physically active. Her diet is irregular due to her busy schedule, focusing on snacking rather than full meals, but she is trying to increase her intake of protein and vegetables.  No chest pain, shortness of breath, abdominal pain, constipation, diarrhea, dysuria, abnormal discharge, dyspareunia, headaches, dizziness, dysphagia, skin changes, rashes, peripheral edema, arthralgia, mood problems, anxiety, or depression. Reports good sleep.      Social History   Tobacco Use  Smoking Status Never  Smokeless Tobacco Never    No current outpatient medications on file prior to visit.   No current facility-administered medications on file prior to visit.     ROS see history of present illness  Objective  Physical Exam Vitals:   12/09/23 1315  BP: 118/72  Pulse: 84  Temp: 98.3 F (36.8 C)  SpO2: 99%    BP Readings from Last 3 Encounters:  12/09/23 118/72  11/08/23 117/71   11/07/23 111/70   Wt Readings from Last 3 Encounters:  12/09/23 138 lb 6.4 oz (62.8 kg)  11/08/23 141 lb 15.6 oz (64.4 kg)  11/07/23 141 lb 15.6 oz (64.4 kg)    Physical Exam Constitutional:      General: She is not in acute distress.    Appearance: Normal appearance.  HENT:     Head: Normocephalic.     Right Ear: Tympanic membrane normal.     Left Ear: Tympanic membrane normal.     Nose: Nose normal.     Mouth/Throat:     Mouth: Mucous membranes are moist.     Pharynx: Oropharynx is clear.  Eyes:     Conjunctiva/sclera: Conjunctivae normal.     Pupils: Pupils are equal, round, and reactive to light.  Neck:     Thyroid : No thyromegaly.  Cardiovascular:     Rate and Rhythm: Normal rate and regular rhythm.     Heart sounds: Normal heart sounds.  Pulmonary:     Effort: Pulmonary effort is normal.     Breath sounds: Normal breath sounds.  Abdominal:     General: Abdomen is flat. Bowel sounds are normal.     Palpations: Abdomen is soft. There is no mass.     Tenderness: There is no abdominal tenderness.  Musculoskeletal:        General: Normal range of motion.  Lymphadenopathy:     Cervical: No cervical adenopathy.  Skin:    General: Skin is warm and dry.     Findings: No rash.  Neurological:     General: No focal deficit present.     Mental Status: She is alert.  Psychiatric:        Mood and Affect: Mood normal.        Behavior: Behavior normal.      Assessment/Plan: Please see individual problem list.  Routine general medical examination at a health care facility Assessment & Plan: Physical exam complete. We will check lab work as outlined. There is no family history of cancer. She does not use substances. Her diet is irregular, but she maintains an active lifestyle without formal exercise. Sleep is good, and there are no mood issues or significant symptoms. She should confirm Pap smear and tetanus vaccination status with her OB-GYN. Flu vaccine is up to date.  Declines additional COVID vaccines. Encouraged a balanced diet with protein and vegetables, a regular sleep schedule, and stress management. Continue routine dental and eye exams. Discussed the importance of regular exercise and encouraged formal exercise. Return to care in one year, sooner as needed.   Orders: -     CBC with Differential/Platelet -     Comprehensive metabolic panel with GFR -     VITAMIN D  25 Hydroxy (Vit-D Deficiency, Fractures)  Screening-pulmonary TB -     QuantiFERON-TB Gold Plus  Lipid screening -     Lipid panel  Thyroid  disorder screen -     TSH    Return in about 1 year (around 12/08/2024) for Annual Exam, sooner as needed.   Leron Glance, NP-C [facility]

## 2023-12-11 LAB — QUANTIFERON-TB GOLD PLUS
Mitogen-NIL: 3.41 [IU]/mL
NIL: 0.01 [IU]/mL
QuantiFERON-TB Gold Plus: NEGATIVE
TB1-NIL: 0 [IU]/mL
TB2-NIL: 0 [IU]/mL

## 2023-12-16 ENCOUNTER — Ambulatory Visit: Payer: Self-pay | Admitting: Nurse Practitioner

## 2023-12-16 DIAGNOSIS — D649 Anemia, unspecified: Secondary | ICD-10-CM

## 2023-12-21 ENCOUNTER — Encounter: Payer: Self-pay | Admitting: Nurse Practitioner

## 2023-12-21 NOTE — Assessment & Plan Note (Signed)
 Physical exam complete. We will check lab work as outlined. There is no family history of cancer. She does not use substances. Her diet is irregular, but she maintains an active lifestyle without formal exercise. Sleep is good, and there are no mood issues or significant symptoms. She should confirm Pap smear and tetanus vaccination status with her OB-GYN. Flu vaccine is up to date. Declines additional COVID vaccines. Encouraged a balanced diet with protein and vegetables, a regular sleep schedule, and stress management. Continue routine dental and eye exams. Discussed the importance of regular exercise and encouraged formal exercise. Return to care in one year, sooner as needed.

## 2023-12-29 ENCOUNTER — Other Ambulatory Visit

## 2024-01-08 DIAGNOSIS — Z419 Encounter for procedure for purposes other than remedying health state, unspecified: Secondary | ICD-10-CM | POA: Diagnosis not present

## 2024-02-18 ENCOUNTER — Telehealth: Payer: Self-pay | Admitting: Nurse Practitioner

## 2024-02-18 NOTE — Telephone Encounter (Signed)
 Pt dropped of physical exam form needed to be completed for school. It's in the color folder up front

## 2024-02-25 NOTE — Telephone Encounter (Signed)
 Called and informed pt that her forms were ready for pick up. Forms placed up front in designated pick up area

## 2024-12-14 ENCOUNTER — Encounter: Admitting: Nurse Practitioner
